# Patient Record
Sex: Female | Born: 1960 | Race: White | Hispanic: No | Marital: Married | Smoking: Current every day smoker
Health system: Southern US, Community
[De-identification: ages and names within clinical notes are randomized; demographics above are authoritative.]

## PROBLEM LIST (undated history)

## (undated) DIAGNOSIS — I10 Essential (primary) hypertension: Secondary | ICD-10-CM

## (undated) DIAGNOSIS — E039 Hypothyroidism, unspecified: Secondary | ICD-10-CM

## (undated) DIAGNOSIS — E079 Disorder of thyroid, unspecified: Secondary | ICD-10-CM

## (undated) HISTORY — PX: TONSILLECTOMY: SUR1361

---

## 1998-01-12 ENCOUNTER — Other Ambulatory Visit: Admission: RE | Admit: 1998-01-12 | Discharge: 1998-01-12 | Payer: Self-pay | Admitting: Obstetrics and Gynecology

## 1999-08-24 ENCOUNTER — Other Ambulatory Visit: Admission: RE | Admit: 1999-08-24 | Discharge: 1999-08-24 | Payer: Self-pay | Admitting: Obstetrics and Gynecology

## 2000-09-09 ENCOUNTER — Other Ambulatory Visit: Admission: RE | Admit: 2000-09-09 | Discharge: 2000-09-09 | Payer: Self-pay | Admitting: Obstetrics and Gynecology

## 2001-09-10 ENCOUNTER — Other Ambulatory Visit: Admission: RE | Admit: 2001-09-10 | Discharge: 2001-09-10 | Payer: Self-pay | Admitting: Obstetrics and Gynecology

## 2002-02-16 ENCOUNTER — Encounter: Payer: Self-pay | Admitting: Gastroenterology

## 2002-02-16 ENCOUNTER — Encounter: Admission: RE | Admit: 2002-02-16 | Discharge: 2002-02-16 | Payer: Self-pay | Admitting: Gastroenterology

## 2002-11-17 ENCOUNTER — Other Ambulatory Visit: Admission: RE | Admit: 2002-11-17 | Discharge: 2002-11-17 | Payer: Self-pay | Admitting: Obstetrics and Gynecology

## 2004-04-12 ENCOUNTER — Other Ambulatory Visit: Admission: RE | Admit: 2004-04-12 | Discharge: 2004-04-12 | Payer: Self-pay | Admitting: Obstetrics and Gynecology

## 2005-06-05 ENCOUNTER — Other Ambulatory Visit: Admission: RE | Admit: 2005-06-05 | Discharge: 2005-06-05 | Payer: Self-pay | Admitting: Obstetrics and Gynecology

## 2007-10-31 ENCOUNTER — Emergency Department (HOSPITAL_COMMUNITY): Admission: EM | Admit: 2007-10-31 | Discharge: 2007-10-31 | Payer: Self-pay | Admitting: Emergency Medicine

## 2012-04-21 ENCOUNTER — Emergency Department (HOSPITAL_COMMUNITY): Payer: 59

## 2012-04-21 ENCOUNTER — Emergency Department (HOSPITAL_COMMUNITY)
Admission: EM | Admit: 2012-04-21 | Discharge: 2012-04-21 | Disposition: A | Discharge status: Home / Self Care | Payer: 59 | Attending: Emergency Medicine | Admitting: Emergency Medicine

## 2012-04-21 ENCOUNTER — Encounter (HOSPITAL_COMMUNITY): Payer: Self-pay | Admitting: *Deleted

## 2012-04-21 DIAGNOSIS — Y998 Other external cause status: Secondary | ICD-10-CM | POA: Insufficient documentation

## 2012-04-21 DIAGNOSIS — E079 Disorder of thyroid, unspecified: Secondary | ICD-10-CM | POA: Insufficient documentation

## 2012-04-21 DIAGNOSIS — Y9389 Activity, other specified: Secondary | ICD-10-CM | POA: Insufficient documentation

## 2012-04-21 DIAGNOSIS — Y92009 Unspecified place in unspecified non-institutional (private) residence as the place of occurrence of the external cause: Secondary | ICD-10-CM | POA: Insufficient documentation

## 2012-04-21 DIAGNOSIS — W208XXA Other cause of strike by thrown, projected or falling object, initial encounter: Secondary | ICD-10-CM | POA: Insufficient documentation

## 2012-04-21 DIAGNOSIS — S9030XA Contusion of unspecified foot, initial encounter: Secondary | ICD-10-CM

## 2012-04-21 HISTORY — DX: Disorder of thyroid, unspecified: E07.9

## 2012-04-21 MED ORDER — HYDROMORPHONE HCL PF 1 MG/ML IJ SOLN
0.5000 mg | Freq: Once | INTRAMUSCULAR | Status: AC
  Administered 2012-04-21: 0.5 mg via INTRAVENOUS
  Filled 2012-04-21: qty 1

## 2012-04-21 MED ORDER — ONDANSETRON HCL 4 MG/2ML IJ SOLN
4.0000 mg | Freq: Once | INTRAMUSCULAR | Status: AC
  Administered 2012-04-21: 4 mg via INTRAVENOUS
  Filled 2012-04-21: qty 2

## 2012-04-21 MED ORDER — HYDROCODONE-ACETAMINOPHEN 5-325 MG PO TABS: 2.0000 | ORAL_TABLET | ORAL | Status: AC | PRN

## 2012-04-21 MED ORDER — IBUPROFEN 800 MG PO TABS: 800.0000 mg | ORAL_TABLET | Freq: Three times a day (TID) | ORAL | Status: AC

## 2012-04-21 NOTE — ED Notes (Signed)
Per EMS, pt from home with reports of left foot injury after a metal table vice fell off table when pt was attempting to move it hitting left knee, left shin and left toes/front of foot. EMS reports left foot swelling but was wrapped upon their arrival. EMS reports giving 350 mcgs IV Fentanyl and 4mg  IV Zofran en route. Pt also arrives with 2 18G IVs in both ACs.

## 2012-04-21 NOTE — ED Notes (Signed)
Patient transported to X-ray 

## 2012-04-21 NOTE — ED Provider Notes (Signed)
History     CSN: [id number]  Arrival date & time 04/21/12  1737   First MD Initiated Contact with Patient 04/21/12 1806      Chief Complaint  Patient presents with  . Foot Injury    left  . Foot Pain    left  . Foot Swelling    left    (Consider location/radiation/quality/duration/timing/severity/associated sxs/prior treatment) HPI GWIN PANGILINAN is a 51 y.o. female presents with left foot injury after a metal table place or and will fell off a table and she was moving things around in her garage. Patient says she's not able to walk on that left foot and he immediately wrapped it. She says she passed out due to pain. Patient received 350 mcg of IV fentanyl and 4 mg IV Zofran en route. Pain is currently 7/10 and in the left foot, is throbbing, nonradiating, it was alleviated somewhat by retinal but still hurts, is worse on movement. She is able to wiggle her toes. No numbness.  Patients having some nausea and vomited once probably secondary to pain medication.  Past Medical History  Diagnosis Date  . Thyroid disease     No past surgical history on file.  No family history on file.  History  Substance Use Topics  . Smoking status: Not on file  . Smokeless tobacco: Not on file  . Alcohol Use:     OB History    Grav Para Term Preterm Abortions TAB SAB Ect Mult Living                  Review of Systems positive for pain in the left foot, mild nausea; Patient denies any fevers or chills, changes in vision, earache, sore throat, neck pain or stiffness, chest pain or pressure, palpitations, syncope, dyspnea, cough, wheezing, abdominal pain, diarrhea, melena, red bloody stools, frequency, dysuria, myalgias, arthralgias, back pain, recent trauma, rash, itching, skin lesions, easy bruising or bleeding, headache, seizures, numbness, tingling or weakness.   Allergies  Demerol; Penicillins; and Sulfa antibiotics  Home Medications   Current Outpatient Rx  Name Route Sig  Dispense Refill  . LEVOTHYROXINE SODIUM 100 MCG PO TABS Oral Take 100 mcg by mouth daily.    Marland Kitchen VITAMIN D (ERGOCALCIFEROL) 50000 UNITS PO CAPS Oral Take 50,000 Units by mouth every 7 (seven) days. On saturdays      BP 167/72  Pulse 95  Temp 99 F (37.2 C)  Resp 20  SpO2 99%  Physical Exam  Nursing notes reviewed.  Electronic medical record reviewed. VITAL SIGNS:   Filed Vitals:   04/21/12 1749 04/21/12 1800  BP:  167/72  Pulse:  95  Temp:  99 F (37.2 C)  Resp:  20  SpO2: 100% 99%   CONSTITUTIONAL: Awake, oriented, appears non-toxic HENT: Atraumatic, normocephalic, oral mucosa pink and moist, airway patent. Nares patent without drainage. External ears normal. EYES: Conjunctiva clear, EOMI, PERRLA NECK: Trachea midline, non-tender, supple CARDIOVASCULAR: Normal heart rate, Normal rhythm, No murmurs, rubs, gallops PULMONARY/CHEST: Clear to auscultation, no rhonchi, wheezes, or rales. Symmetrical breath sounds. Non-tender. ABDOMINAL: Non-distended, soft, non-tender - no rebound or guarding.  BS normal. NEUROLOGIC: Non-focal, moving all four extremities, no gross sensory or motor deficits. EXTREMITIES: No clubbing, cyanosis, or edema. Some mild swelling to the left lower foot it is starting to bruise. Palpation along the lateral aspect - no crepitance, no gross deformity, no hematoma, neurovascularly intact-able to wiggle toes and feel sensation. SKIN: Warm, Dry, No erythema, No rash  ED Course  Procedures (including critical care time)  Labs Reviewed - No data to display Dg Foot Complete Left  04/21/2012  *RADIOLOGY REPORT*  Clinical Data: Blunt trauma to the dorsum of the left foot.  LEFT FOOT - COMPLETE 3+ VIEW  Comparison: None.  Findings: Soft tissue swelling is present in the dorsal aspect of the foot.  There is no acute osseous abnormality.  No radiopaque foreign body is present.  IMPRESSION:  1.  Soft tissue swelling over the dorsum of the foot compatible with edema or  hematoma. 2.  No acute osseous abnormality.   Original Report Authenticated By: Jamesetta Orleans. MATTERN, M.D.      1. Foot contusion       MDM  ELIZABETHANNE LUSHER is a 51 y.o. female presenting after dropping a heavy object on her left foot. X-rays show no fracture in the foot. Discussed this with the patient, also discussed that there is an occult fracture, she may need a repeat x-ray in a week or 2. She can followup with her primary care physician to have that foot reexamined. Patient vomited once after I spoke with her, since after 8 mg of Zofran, added on some more, this is likely due to medications.  I explained the diagnosis and have given explicit precautions to return to the ER including worsening foot pain loss of sensation or any other new or worsening symptoms. The patient understands and accepts the medical plan as it's been dictated and I have answered their questions. Discharge instructions concerning home care and prescriptions have been given.  The patient is STABLE and is discharged to home in good condition.         Jones Skene, MD 04/21/12 1900

## 2013-10-26 ENCOUNTER — Ambulatory Visit (INDEPENDENT_AMBULATORY_CARE_PROVIDER_SITE_OTHER): Payer: 59 | Admitting: Surgery

## 2013-10-26 ENCOUNTER — Observation Stay (HOSPITAL_COMMUNITY)
Admission: AD | Admit: 2013-10-26 | Discharge: 2013-10-27 | Disposition: A | Discharge status: Home / Self Care | Payer: 59 | Source: Ambulatory Visit | Attending: Surgery | Admitting: Surgery

## 2013-10-26 ENCOUNTER — Encounter (HOSPITAL_COMMUNITY): Payer: Self-pay | Admitting: *Deleted

## 2013-10-26 ENCOUNTER — Encounter (INDEPENDENT_AMBULATORY_CARE_PROVIDER_SITE_OTHER): Payer: Self-pay | Admitting: Surgery

## 2013-10-26 VITALS — BP 146/84 | HR 78 | Temp 98.5°F | Resp 16 | Ht 65.0 in | Wt 139.0 lb

## 2013-10-26 DIAGNOSIS — Z79899 Other long term (current) drug therapy: Secondary | ICD-10-CM | POA: Insufficient documentation

## 2013-10-26 DIAGNOSIS — K6289 Other specified diseases of anus and rectum: Secondary | ICD-10-CM | POA: Diagnosis present

## 2013-10-26 DIAGNOSIS — K602 Anal fissure, unspecified: Secondary | ICD-10-CM | POA: Insufficient documentation

## 2013-10-26 DIAGNOSIS — K648 Other hemorrhoids: Principal | ICD-10-CM | POA: Insufficient documentation

## 2013-10-26 DIAGNOSIS — E039 Hypothyroidism, unspecified: Secondary | ICD-10-CM | POA: Insufficient documentation

## 2013-10-26 DIAGNOSIS — F17200 Nicotine dependence, unspecified, uncomplicated: Secondary | ICD-10-CM | POA: Insufficient documentation

## 2013-10-26 HISTORY — DX: Hypothyroidism, unspecified: E03.9

## 2013-10-26 LAB — BASIC METABOLIC PANEL
BUN: 12 mg/dL (ref 6–23)
CALCIUM: 9.6 mg/dL (ref 8.4–10.5)
CO2: 24 mEq/L (ref 19–32)
CREATININE: 1.01 mg/dL (ref 0.50–1.10)
Chloride: 101 mEq/L (ref 96–112)
GFR calc non Af Amer: 63 mL/min — ABNORMAL LOW (ref >90)
GFR, EST AFRICAN AMERICAN: 73 mL/min — AB (ref >90)
Glucose, Bld: 91 mg/dL (ref 70–99)
Potassium: 3.6 mEq/L — ABNORMAL LOW (ref 3.7–5.3)
SODIUM: 138 meq/L (ref 137–147)

## 2013-10-26 LAB — CBC
HCT: 37.5 % (ref 36.0–46.0)
Hemoglobin: 12.9 g/dL (ref 12.0–15.0)
MCH: 28.3 pg (ref 26.0–34.0)
MCHC: 34.4 g/dL (ref 30.0–36.0)
MCV: 82.2 fL (ref 78.0–100.0)
PLATELETS: 269 10*3/uL (ref 150–400)
RBC: 4.56 MIL/uL (ref 3.87–5.11)
RDW: 13.2 % (ref 11.5–15.5)
WBC: 7 10*3/uL (ref 4.0–10.5)

## 2013-10-26 MED ORDER — ENOXAPARIN SODIUM 40 MG/0.4ML ~~LOC~~ SOLN: 40.0000 mg | SUBCUTANEOUS | Status: DC

## 2013-10-26 MED ORDER — POTASSIUM CHLORIDE IN NACL 20-0.9 MEQ/L-% IV SOLN
INTRAVENOUS | Status: DC
  Administered 2013-10-26 – 2013-10-27 (×2): via INTRAVENOUS
  Filled 2013-10-26 (×3): qty 1000

## 2013-10-26 MED ORDER — ENOXAPARIN SODIUM 40 MG/0.4ML ~~LOC~~ SOLN
40.0000 mg | Freq: Once | SUBCUTANEOUS | Status: DC
  Filled 2013-10-26: qty 0.4

## 2013-10-26 MED ORDER — HYDROMORPHONE HCL PF 1 MG/ML IJ SOLN: 1.0000 mg | INTRAMUSCULAR | Status: DC | PRN

## 2013-10-26 MED ORDER — HYDROCHLOROTHIAZIDE 50 MG PO TABS
50.0000 mg | ORAL_TABLET | Freq: Every day | ORAL | Status: DC
  Administered 2013-10-27: 50 mg via ORAL
  Filled 2013-10-26 (×2): qty 1

## 2013-10-26 MED ORDER — ONDANSETRON HCL 4 MG/2ML IJ SOLN: 4.0000 mg | Freq: Four times a day (QID) | INTRAMUSCULAR | Status: DC | PRN

## 2013-10-26 NOTE — Progress Notes (Signed)
Pt continues to run an elevated bp. MD made aware. New order given. Vwilliams, rn.

## 2013-10-26 NOTE — Progress Notes (Signed)
Patient ID: Gina Lucero, female   DOB: [birth year], 53 y.o.   MRN: [id number]  Chief Complaint  Patient presents with  . Hemorrhoids    HPI Gina Lucero is a 53 y.o. female.   HPI Severe rectal pain  This is a pleasant female referred by Dr. Herb Graysammy Spear for evaluation of severe rectal pain. She has had hemorrhoids in the past and has had both surgery and incision and drainages. She has severe diarrhea several days ago and developed significant perianal discomfort. Bowel movements remain painful.  She denies seeing any blood. She has constipation and reports that she has to digitally help her self move her bowels occasionally. She has a history of colon polyps. She has no issues with continence. Apparently, during anoscopy yesterday, a rectal mass was visualized. Past Medical History  Diagnosis Date  . Thyroid disease     History reviewed. No pertinent past surgical history.  History reviewed. No pertinent family history.  Social History History  Substance Use Topics  . Smoking status: Current Every Day Smoker -- 1.00 packs/day    Types: Cigarettes  . Smokeless tobacco: Never Used  . Alcohol Use: Yes     Comment: seldom    Allergies  Allergen Reactions  . Demerol [Meperidine] Nausea And Vomiting  . Penicillins Rash  . Sulfa Antibiotics Rash    Current Outpatient Prescriptions  Medication Sig Dispense Refill  . acyclovir (ZOVIRAX) 200 MG capsule       . HYDROcodone-acetaminophen (NORCO/VICODIN) 5-325 MG per tablet       . levothyroxine (SYNTHROID, LEVOTHROID) 100 MCG tablet Take 100 mcg by mouth daily.      Marland Kitchen. lidocaine (XYLOCAINE) 5 % ointment       . Vitamin D, Ergocalciferol, (DRISDOL) 50000 UNITS CAPS Take 50,000 Units by mouth every 7 (seven) days. On saturdays       No current facility-administered medications for this visit.    Review of Systems Review of Systems  Constitutional: Negative for fever, chills and unexpected weight change.  HENT: Negative for  congestion, hearing loss, sore throat, trouble swallowing and voice change.   Eyes: Negative for visual disturbance.  Respiratory: Negative for cough and wheezing.   Cardiovascular: Negative for chest pain, palpitations and leg swelling.  Gastrointestinal: Positive for rectal pain. Negative for nausea, vomiting, abdominal pain, diarrhea, constipation, blood in stool, abdominal distention and anal bleeding.  Genitourinary: Negative for hematuria, vaginal bleeding and difficulty urinating.  Musculoskeletal: Negative for arthralgias.  Skin: Negative for rash and wound.  Neurological: Negative for seizures, syncope and headaches.  Hematological: Negative for adenopathy. Does not bruise/bleed easily.  Psychiatric/Behavioral: Negative for confusion.    Blood pressure 146/84, pulse 78, temperature 98.5 F (36.9 C), temperature source Oral, resp. rate 16, height 5\' 5"  (1.651 m), weight 139 lb (63.05 kg).  Physical Exam Physical Exam  Constitutional: She appears well-developed and well-nourished. She appears distressed.  Cardiovascular: Normal rate, regular rhythm and normal heart sounds.   Pulmonary/Chest: Effort normal and breath sounds normal. No respiratory distress. She has no wheezes.  Abdominal: Soft. Bowel sounds are normal. She exhibits no distension. There is no tenderness.  Genitourinary:  There are swollen hemorrhoids externally. On digital exam, she has strong sphincter muscles. I can palpate a soft mass or polyp near the posterior midline. Her exam is significantly tender circumferentially.    Data Reviewed   Assessment    Severe rectal pain with rectal mass     Plan    Given her  significant discomfort, I believe she needs admission to the hospital with exam under anesthesia in the operating room to see if this is a torsed polyp or hemorrhoids versus abscess or rectal malignancy. I discussed this with our on-call physician. I will major in by mouth at midnight for possible  surgery tomorrow        Kaysie Michelini A 10/26/2013, 4:23 PM

## 2013-10-27 ENCOUNTER — Observation Stay (HOSPITAL_COMMUNITY): Payer: 59 | Admitting: Registered Nurse

## 2013-10-27 ENCOUNTER — Encounter (HOSPITAL_COMMUNITY): Admission: AD | Disposition: A | Discharge status: Home / Self Care | Payer: Self-pay | Source: Ambulatory Visit

## 2013-10-27 ENCOUNTER — Encounter (HOSPITAL_COMMUNITY): Payer: Self-pay

## 2013-10-27 ENCOUNTER — Encounter (HOSPITAL_COMMUNITY): Payer: 59 | Admitting: Registered Nurse

## 2013-10-27 DIAGNOSIS — K648 Other hemorrhoids: Secondary | ICD-10-CM

## 2013-10-27 HISTORY — PX: HEMORRHOID SURGERY: SHX153

## 2013-10-27 SURGERY — HEMORRHOIDECTOMY
Anesthesia: General

## 2013-10-27 MED ORDER — ONDANSETRON HCL 4 MG/2ML IJ SOLN
INTRAMUSCULAR | Status: DC | PRN
  Administered 2013-10-27: 4 mg via INTRAVENOUS

## 2013-10-27 MED ORDER — CIPROFLOXACIN IN D5W 400 MG/200ML IV SOLN
400.0000 mg | Freq: Once | INTRAVENOUS | Status: AC
  Administered 2013-10-27: 400 mg via INTRAVENOUS
  Filled 2013-10-27: qty 200

## 2013-10-27 MED ORDER — OXYCODONE HCL 5 MG PO TABS: 5.0000 mg | ORAL_TABLET | ORAL | Status: DC | PRN

## 2013-10-27 MED ORDER — BUPIVACAINE HCL (PF) 0.5 % IJ SOLN
INTRAMUSCULAR | Status: AC
  Filled 2013-10-27: qty 30

## 2013-10-27 MED ORDER — ACETAMINOPHEN 10 MG/ML IV SOLN
1000.0000 mg | Freq: Once | INTRAVENOUS | Status: DC
  Filled 2013-10-27: qty 100

## 2013-10-27 MED ORDER — PROMETHAZINE HCL 25 MG/ML IJ SOLN: 6.2500 mg | INTRAMUSCULAR | Status: DC | PRN

## 2013-10-27 MED ORDER — LACTATED RINGERS IV SOLN
INTRAVENOUS | Status: DC
Start: 2013-10-27 — End: 2013-10-27
  Administered 2013-10-27: 15:00:00 via INTRAVENOUS
  Administered 2013-10-27: 1000 mL via INTRAVENOUS

## 2013-10-27 MED ORDER — FENTANYL CITRATE 0.05 MG/ML IJ SOLN
INTRAMUSCULAR | Status: AC
  Filled 2013-10-27: qty 5

## 2013-10-27 MED ORDER — LIDOCAINE HCL (CARDIAC) 10 MG/ML IV SOLN
INTRAVENOUS | Status: DC | PRN
  Administered 2013-10-27: 50 mg via INTRAVENOUS
  Administered 2013-10-27: 100 mg via INTRAVENOUS

## 2013-10-27 MED ORDER — LIDOCAINE HCL (CARDIAC) 20 MG/ML IV SOLN
INTRAVENOUS | Status: AC
  Filled 2013-10-27: qty 5

## 2013-10-27 MED ORDER — BUPIVACAINE LIPOSOME 1.3 % IJ SUSP
20.0000 mL | Freq: Once | INTRAMUSCULAR | Status: AC
  Administered 2013-10-27: 20 mL
  Filled 2013-10-27: qty 20

## 2013-10-27 MED ORDER — METRONIDAZOLE IN NACL 5-0.79 MG/ML-% IV SOLN
500.0000 mg | Freq: Once | INTRAVENOUS | Status: AC
Start: 2013-10-27 — End: 2013-10-27
  Administered 2013-10-27: 500 mg via INTRAVENOUS
  Filled 2013-10-27: qty 100

## 2013-10-27 MED ORDER — FENTANYL CITRATE 0.05 MG/ML IJ SOLN: 25.0000 ug | INTRAMUSCULAR | Status: DC | PRN

## 2013-10-27 MED ORDER — 0.9 % SODIUM CHLORIDE (POUR BTL) OPTIME
TOPICAL | Status: DC | PRN
  Administered 2013-10-27: 1000 mL

## 2013-10-27 MED ORDER — LACTATED RINGERS IV SOLN: INTRAVENOUS | Status: DC

## 2013-10-27 MED ORDER — MIDAZOLAM HCL 5 MG/5ML IJ SOLN
INTRAMUSCULAR | Status: DC | PRN
  Administered 2013-10-27: 2 mg via INTRAVENOUS

## 2013-10-27 MED ORDER — DEXAMETHASONE SODIUM PHOSPHATE 10 MG/ML IJ SOLN
INTRAMUSCULAR | Status: AC
  Filled 2013-10-27: qty 1

## 2013-10-27 MED ORDER — MIDAZOLAM HCL 2 MG/2ML IJ SOLN
INTRAMUSCULAR | Status: AC
  Filled 2013-10-27: qty 2

## 2013-10-27 MED ORDER — DEXAMETHASONE SODIUM PHOSPHATE 10 MG/ML IJ SOLN
INTRAMUSCULAR | Status: DC | PRN
  Administered 2013-10-27: 10 mg via INTRAVENOUS

## 2013-10-27 MED ORDER — PROPOFOL 10 MG/ML IV BOLUS
INTRAVENOUS | Status: AC
  Filled 2013-10-27: qty 20

## 2013-10-27 MED ORDER — SODIUM CHLORIDE 0.9 % IJ SOLN
INTRAMUSCULAR | Status: AC
  Filled 2013-10-27: qty 10

## 2013-10-27 MED ORDER — FENTANYL CITRATE 0.05 MG/ML IJ SOLN
INTRAMUSCULAR | Status: DC | PRN
  Administered 2013-10-27: 50 ug via INTRAVENOUS
  Administered 2013-10-27: 100 ug via INTRAVENOUS

## 2013-10-27 MED ORDER — PROPOFOL 10 MG/ML IV BOLUS
INTRAVENOUS | Status: DC | PRN
  Administered 2013-10-27: 200 mg via INTRAVENOUS

## 2013-10-27 MED ORDER — ONDANSETRON HCL 4 MG/2ML IJ SOLN
INTRAMUSCULAR | Status: AC
  Filled 2013-10-27: qty 2

## 2013-10-27 SURGICAL SUPPLY — 35 items
BLADE HEX COATED 2.75 (ELECTRODE) ×2 IMPLANT
BLADE SURG 15 STRL LF DISP TIS (BLADE) ×1 IMPLANT
BLADE SURG 15 STRL SS (BLADE) ×2
BRIEF STRETCH FOR OB PAD LRG (UNDERPADS AND DIAPERS) ×3 IMPLANT
CANISTER SUCTION 2500CC (MISCELLANEOUS) ×1 IMPLANT
DECANTER SPIKE VIAL GLASS SM (MISCELLANEOUS) ×1 IMPLANT
DRAPE LG THREE QUARTER DISP (DRAPES) ×2 IMPLANT
DRSG PAD ABDOMINAL 8X10 ST (GAUZE/BANDAGES/DRESSINGS) IMPLANT
ELECT REM PT RETURN 9FT ADLT (ELECTROSURGICAL) ×2
ELECTRODE REM PT RTRN 9FT ADLT (ELECTROSURGICAL) ×1 IMPLANT
GAUZE SPONGE 4X4 16PLY XRAY LF (GAUZE/BANDAGES/DRESSINGS) ×2 IMPLANT
GLOVE BIOGEL PI IND STRL 7.0 (GLOVE) ×1 IMPLANT
GLOVE BIOGEL PI INDICATOR 7.0 (GLOVE) ×1
GLOVE ECLIPSE 8.0 STRL XLNG CF (GLOVE) ×2 IMPLANT
GLOVE INDICATOR 8.0 STRL GRN (GLOVE) ×4 IMPLANT
GOWN STRL REUS W/TWL LRG LVL3 (GOWN DISPOSABLE) ×2 IMPLANT
GOWN STRL REUS W/TWL XL LVL3 (GOWN DISPOSABLE) ×3 IMPLANT
KIT BASIN OR (CUSTOM PROCEDURE TRAY) ×2 IMPLANT
LUBRICANT JELLY K Y 4OZ (MISCELLANEOUS) ×2 IMPLANT
MANIFOLD NEPTUNE II (INSTRUMENTS) ×1 IMPLANT
NDL HYPO 25X1 1.5 SAFETY (NEEDLE) ×1 IMPLANT
NEEDLE HYPO 25X1 1.5 SAFETY (NEEDLE) ×2 IMPLANT
NS IRRIG 1000ML POUR BTL (IV SOLUTION) ×2 IMPLANT
PACK LITHOTOMY IV (CUSTOM PROCEDURE TRAY) ×2 IMPLANT
PAD ABD 8X10 STRL (GAUZE/BANDAGES/DRESSINGS) ×1 IMPLANT
PENCIL BUTTON HOLSTER BLD 10FT (ELECTRODE) ×2 IMPLANT
SHEARS HARMONIC 9CM CVD (BLADE) ×1 IMPLANT
SPONGE GAUZE 4X4 12PLY (GAUZE/BANDAGES/DRESSINGS) IMPLANT
SPONGE SURGIFOAM ABS GEL 100 (HEMOSTASIS) ×1 IMPLANT
SPONGE SURGIFOAM ABS GEL 12-7 (HEMOSTASIS) ×1 IMPLANT
SUT CHROMIC 2 0 SH (SUTURE) IMPLANT
SUT CHROMIC 3 0 SH 27 (SUTURE) IMPLANT
SYR CONTROL 10ML LL (SYRINGE) ×2 IMPLANT
TOWEL OR 17X26 10 PK STRL BLUE (TOWEL DISPOSABLE) ×2 IMPLANT
YANKAUER SUCT BULB TIP 10FT TU (MISCELLANEOUS) ×2 IMPLANT

## 2013-10-27 NOTE — Progress Notes (Signed)
Doing well postop.  Wants to go home and I think this is appropriate given the operation and findings.  Will discharge.

## 2013-10-27 NOTE — Progress Notes (Signed)
Patient verbalized understanding of discharge instructions. Patient assessment has not changed from am. Patient is stable at discharge. Patient was given prescription and d/c forms.

## 2013-10-27 NOTE — Discharge Instructions (Signed)
[facility]  RECTAL SURGERY POST OP INSTRUCTIONS: POST OP INSTRUCTIONS  Always review your discharge instruction sheet given to you by the facility where your surgery was performed. IF YOU HAVE DISABILITY OR FAMILY LEAVE FORMS, YOU MUST BRING THEM TO THE OFFICE FOR PROCESSING.   DO NOT GIVE THEM TO YOUR DOCTOR.  1. A  prescription for pain medication may be given to you upon discharge.  Take your pain medication as prescribed, if needed.  If narcotic pain medicine is not needed, then you may take acetaminophen (Tylenol) or ibuprofen (Advil) as needed. 2. Take your usually prescribed medications unless otherwise directed. 3. If you need a refill on your pain medication, please contact your pharmacy.  They will contact our office to request authorization. Prescriptions will not be filled after 5 pm or on week-ends. 4. You should follow a light diet the first 48 hours after arrival home, such as soup and crackers, etc.  Be sure to include lots of fluids daily.  Resume your normal diet 2-3 days after surgery.. 5. Most patients will experience some swelling and discomfort in the rectal area. Ice packs, reclining and warm tub soaks will help.  Swelling and discomfort can take several days to resolve.  6. It is common to experience some constipation if taking pain medication after surgery.  Increasing fluid intake and taking a stool softener (such as Colace) will usually help or prevent this problem from occurring.  A mild laxative (Milk of Magnesia or Miralax) should be taken according to package directions if there are no bowel movements after 48 hours. 7. Unless discharge instructions indicate otherwise, leave your bandage dry and in place for 24 hours, or remove the bandage if you have a bowel movement. You may notice a small amount of bleeding with bowel movements for the first few days. You may have some packing in the rectum which will come out over the first day or two. You  will need to wear an absorbent pad or soft cotton gauze in your underwear until the drainage stops.it. 8. ACTIVITIES:  You may resume regular (light) daily activities beginning the next day--such as daily self-care, walking, climbing stairs--gradually increasing activities as tolerated.  You may have sexual intercourse when it is comfortable.  Refrain from any heavy lifting or straining for the next 5 days.  a. You may drive when you are no longer taking prescription pain medication, you can comfortably wear a seatbelt, and you can safely maneuver your car and apply brakes. b. RETURN TO WORK: : 3-5 days when comfortable.____________________ c.  9. You should see your doctor in the office for a follow-up appointment approximately 2-3 weeks after your surgery.  Make sure that you call for this appointment within a day or two after you arrive home to insure a convenient appointment time. 10. OTHER INSTRUCTIONS:  __________________________________________________________________________________________________________________________________________________________________________________________  WHEN TO CALL YOUR DOCTOR: 1. Fever over 101.0 2. Inability to urinate 3. Nausea and/or vomiting 4. Extreme swelling or bruising 5. Continued bleeding from rectum. 6. Increased pain, redness, or drainage from the incision 7. Constipation  The clinic staff is available to answer your questions during regular business hours.  Please dont hesitate to call and ask to speak to one of the nurses for clinical concerns.  If you have a medical emergency, go to the nearest emergency room or call 911.  [facility] is always on call at the hospital   [address], [address],  [place], [place]  [address] ?  [address], [place], [place]   [address] [phone] ? [phone] ? FAX [phone] Web site: [email or web address]

## 2013-10-27 NOTE — Op Note (Signed)
Operative Note  Gina Lucero female 53 y.o. 10/27/2013   PREOPERATIVE DX:  Severe anorectal pain and rectal mass  POSTOPERATIVE DX:  Acute, superficial anal fissure and inflamed, right posterior prolapsing internal hemorrhoid  PROCEDURE:  Examination under anesthesia, anoscopy, single column hemorrhoidectomy (right posterior)         Surgeon: Adolph PollackOSENBOWER,Anaija Wissink J   Assistants: None  Anesthesia: General endotracheal anesthesia  Indications: This is a 53 year old female who had an episode of explosive diarrhea last week. She then began having burning pain that increased in severity. She tried over-the-counter agents without success. She was seen in the urgent office yesterday. She had a lot of pain and tenderness on exam and a rectal mass was noted. She was admitted yesterday and is now brought to the OR for the above procedure.    Procedure Detail:  She was seen in the holding area. She is brought to the operating room placed supine on the operating table and a general anesthetic was given. She was placed in the lithotomy position. The anorectal area was sterilely prepped and draped.  A timeout was performed.  External inspection demonstrated multiple skin tags. An inflamed, fleshy prolapsed right posterior internal hemorrhoid was noted. There is also a superficial acute fissure adjacent to this at the 6:00 position. On digital rectal exam, no firm masses were felt. Anoscopy was performed and no masses were seen other than the internal hemorrhoids as described. The superficial acute anal fissure was also seen.  Using the harmonic scalpel, I performed a single column hemorrhoidectomy of the internal/external hemorrhoid in the right posterior position preserving the muscle. Because the anal fissure was superficial and acute, I did not feel that lateral internal sphincterotomy was indicated. Following hemorrhoidectomy, hemostasis was adequate. An anal block was then performed with Exparel.  A  piece of Gelfoam was placed in the anal canal and a bulky dressing was applied.  She tolerated the procedure well without any apparent complications and was taken to the recovery room in satisfactory condition.    Findings:  A. Acute superficial posterior anal fissure was seen. It was very shallow and small. There was a inflamed, prolapsing right posterior internal hemorrhoid noted.  Estimated Blood Loss:  less than 100 mL         Drains: none  Blood Given: none          Specimens: Right posterior internal and external hemorrhoid.        Complications:  * No complications entered in OR log *         Disposition: PACU - hemodynamically stable.         Condition: stable

## 2013-10-27 NOTE — Discharge Summary (Signed)
Physician Discharge Summary  Patient ID: Gina Lucero MRN: [id number] DOB/AGE: Sep 17, 1960 53 y.o.  Admit date: 10/26/2013 Discharge date: 10/27/2013  Admission Diagnoses:  Severe anorectal pain and rectal mass  Discharge Diagnoses:   Inflamed, prolapsing right posterior hemorrhoid and superficial posterior anal fissure.    Discharged Condition: good  Hospital Course: She was admitted the evening of 3/17 and taken to the OR on 3/18 the above diagnoses were noted.  There was no worrisome anal or distal rectal mass.  She underwent a single column hemorrhoidectomy and did well.  She was able to be discharged soon after the operation.  Instructions were given to her.  Consults: None  Significant Diagnostic Studies: none  Treatments: surgery: As above.  Discharge Exam: Blood pressure 180/84, pulse 55, temperature 97.7 F (36.5 C), temperature source Oral, resp. rate 18, height 5\' 5"  (1.651 m), weight 139 lb (63.05 kg), last menstrual period 10/10/2013, SpO2 98.00%.   Disposition: 01-Home or Self Care     Medication List    STOP taking these medications       HYDROcodone-acetaminophen 5-325 MG per tablet  Commonly known as:  NORCO/VICODIN     hydrocortisone 25 MG suppository  Commonly known as:  ANUSOL-HC     lidocaine 5 % ointment  Commonly known as:  XYLOCAINE      TAKE these medications       acyclovir 200 MG capsule  Commonly known as:  ZOVIRAX  200 mg 2 (two) times daily.     ibuprofen 200 MG tablet  Commonly known as:  ADVIL,MOTRIN  Take 400 mg by mouth every 6 (six) hours as needed (pain).     levothyroxine 100 MCG tablet  Commonly known as:  SYNTHROID, LEVOTHROID  Take 100 mcg by mouth daily.     oxyCODONE 5 MG immediate release tablet  Commonly known as:  Oxy IR/ROXICODONE  Take 1-2 tablets (5-10 mg total) by mouth every 4 (four) hours as needed.     Vitamin D (Ergocalciferol) 50000 UNITS Caps capsule  Commonly known as:  DRISDOL  Take 50,000 Units  by mouth every 7 (seven) days. On saturdays         Signed: Stefana Lodico J 10/27/2013, 5:31 PM

## 2013-10-27 NOTE — Transfer of Care (Signed)
Immediate Anesthesia Transfer of Care Note  Patient: Gina Lucero  Procedure(s) Performed: Procedure(s): Exam under anesthesia  removal of prolapsed hemorrhoid (N/A)  Patient Location: PACU  Anesthesia Type:General  Level of Consciousness: awake, alert  and patient cooperative  Airway & Oxygen Therapy: Patient Spontanous Breathing and Patient connected to face mask oxygen  Post-op Assessment: Report given to PACU RN, Post -op Vital signs reviewed and stable and Patient moving all extremities X 4  Post vital signs: stable  Complications: No apparent anesthesia complications

## 2013-10-27 NOTE — H&P (Signed)
Chief Complaint   Patient presents with   .  Hemorrhoids   HPI  Gina Lucero is a 53 y.o. female.  HPI  Severe rectal pain  This is a pleasant female referred by Dr. Herb Grays for evaluation of severe rectal pain. She has had hemorrhoids in the past and has had both surgery and incision and drainages. She has severe diarrhea several days ago and developed significant perianal discomfort. Bowel movements remain painful. She denies seeing any blood. She has constipation and reports that she has to digitally help her self move her bowels occasionally. She has a history of colon polyps. She has no issues with continence. Apparently, during anoscopy yesterday, a rectal mass was visualized.  Past Medical History   Diagnosis  Date   .  Thyroid disease    History reviewed. No pertinent past surgical history.  History reviewed. No pertinent family history.  Social History  History   Substance Use Topics   .  Smoking status:  Current Every Day Smoker -- 1.00 packs/day     Types:  Cigarettes   .  Smokeless tobacco:  Never Used   .  Alcohol Use:  Yes      Comment: seldom    Allergies   Allergen  Reactions   .  Demerol [Meperidine]  Nausea And Vomiting   .  Penicillins  Rash   .  Sulfa Antibiotics  Rash    Current Outpatient Prescriptions   Medication  Sig  Dispense  Refill   .  acyclovir (ZOVIRAX) 200 MG capsule      .  HYDROcodone-acetaminophen (NORCO/VICODIN) 5-325 MG per tablet      .  levothyroxine (SYNTHROID, LEVOTHROID) 100 MCG tablet  Take 100 mcg by mouth daily.     Marland Kitchen  lidocaine (XYLOCAINE) 5 % ointment      .  Vitamin D, Ergocalciferol, (DRISDOL) 50000 UNITS CAPS  Take 50,000 Units by mouth every 7 (seven) days. On saturdays      No current facility-administered medications for this visit.   Review of Systems  Review of Systems  Constitutional: Negative for fever, chills and unexpected weight change.  HENT: Negative for congestion, hearing loss, sore throat, trouble  swallowing and voice change.  Eyes: Negative for visual disturbance.  Respiratory: Negative for cough and wheezing.  Cardiovascular: Negative for chest pain, palpitations and leg swelling.  Gastrointestinal: Positive for rectal pain. Negative for nausea, vomiting, abdominal pain, diarrhea, constipation, blood in stool, abdominal distention and anal bleeding.  Genitourinary: Negative for hematuria, vaginal bleeding and difficulty urinating.  Musculoskeletal: Negative for arthralgias.  Skin: Negative for rash and wound.  Neurological: Negative for seizures, syncope and headaches.  Hematological: Negative for adenopathy. Does not bruise/bleed easily.  Psychiatric/Behavioral: Negative for confusion.  Blood pressure 146/84, pulse 78, temperature 98.5 F (36.9 C), temperature source Oral, resp. rate 16, height 5\' 5"  (1.651 m), weight 139 lb (63.05 kg).  Physical Exam  Physical Exam  Constitutional: She appears well-developed and well-nourished. She appears distressed.  Cardiovascular: Normal rate, regular rhythm and normal heart sounds.  Pulmonary/Chest: Effort normal and breath sounds normal. No respiratory distress. She has no wheezes.  Abdominal: Soft. Bowel sounds are normal. She exhibits no distension. There is no tenderness.  Genitourinary:  There are swollen hemorrhoids externally. On digital exam, she has strong sphincter muscles. I can palpate a soft mass or polyp near the posterior midline. Her exam is significantly tender circumferentially.  Data Reviewed  Assessment  Severe rectal pain with  rectal mass  Plan  Given her significant discomfort, I believe she needs admission to the hospital with exam under anesthesia in the operating room to see if this is a torsed polyp or hemorrhoids versus abscess or rectal malignancy. I discussed this with our on-call physician. I will major in by mouth at midnight for possible surgery tomorrow

## 2013-10-27 NOTE — Anesthesia Preprocedure Evaluation (Signed)
Anesthesia Evaluation  Patient identified by MRN, date of birth, ID band Patient awake    Reviewed: Allergy & Precautions, H&P , NPO status , Patient's Chart, lab work & pertinent test results  Airway Mallampati: II TM Distance: >3 FB Neck ROM: Full    Dental no notable dental hx.    Pulmonary Current Smoker,  breath sounds clear to auscultation  Pulmonary exam normal       Cardiovascular negative cardio ROS  Rhythm:Regular Rate:Normal     Neuro/Psych negative neurological ROS  negative psych ROS   GI/Hepatic negative GI ROS, Neg liver ROS,   Endo/Other  negative endocrine ROSHypothyroidism   Renal/GU negative Renal ROS  negative genitourinary   Musculoskeletal negative musculoskeletal ROS (+)   Abdominal   Peds negative pediatric ROS (+)  Hematology negative hematology ROS (+)   Anesthesia Other Findings   Reproductive/Obstetrics negative OB ROS                           Anesthesia Physical Anesthesia Plan  ASA: II  Anesthesia Plan: General   Post-op Pain Management:    Induction: Intravenous  Airway Management Planned: LMA and Oral ETT  Additional Equipment:   Intra-op Plan:   Post-operative Plan: Extubation in OR  Informed Consent: I have reviewed the patients History and Physical, chart, labs and discussed the procedure including the risks, benefits and alternatives for the proposed anesthesia with the patient or authorized representative who has indicated his/her understanding and acceptance.   Dental advisory given  Plan Discussed with: CRNA  Anesthesia Plan Comments:         Anesthesia Quick Evaluation

## 2013-10-27 NOTE — Progress Notes (Signed)
  Subjective: Patient seen and examined.  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  Pain a little better this AM.  Objective: Vital signs in last 24 hours: Temp:  [98.1 F (36.7 C)-98.5 F (36.9 C)] 98.1 F (36.7 C) (03/18 0537) Pulse Rate:  [63-87] 67 (03/18 0537) Resp:  [16] 16 (03/18 0537) BP: (144-207)/(72-117) 144/77 mmHg (03/18 0537) SpO2:  [97 %-100 %] 99 % (03/18 0537) Weight:  [139 lb (63.05 kg)] 139 lb (63.05 kg) (03/17 2028) Last BM Date: 10/26/13  Intake/Output from previous day: 03/17 0701 - 03/18 0700 In: 946.3 [P.O.:240; I.V.:706.3] Out: 700 [Urine:700] Intake/Output this shift:    PE: General- In NAD Anorectal-soft, external skin tags, no erythema.  Lab Results:   Recent Labs  10/26/13 1945  WBC 7.0  HGB 12.9  HCT 37.5  PLT 269   BMET  Recent Labs  10/26/13 1945  NA 138  K 3.6*  CL 101  CO2 24  GLUCOSE 91  BUN 12  CREATININE 1.01  CALCIUM 9.6   PT/INR No results found for this basename: LABPROT, INR,  in the last 72 hours Comprehensive Metabolic Panel:    Component Value Date/Time   NA 138 10/26/2013 1945   K 3.6* 10/26/2013 1945   CL 101 10/26/2013 1945   CO2 24 10/26/2013 1945   BUN 12 10/26/2013 1945   CREATININE 1.01 10/26/2013 1945   GLUCOSE 91 10/26/2013 1945   CALCIUM 9.6 10/26/2013 1945     Studies/Results: No results found.  Anti-infectives: Anti-infectives   None      Assessment Principal Problem:   Rectal pain with mass-etiology is unclear.  She has a h/o colon polyps and is due for a colonoscopy.    LOS: 1 day   Plan: To OR for EUA, excision of mass.  I explained to her that this procedure would be diagnostic and possibly therapeutic.  She may require other procedures depending upon what is found.  We discussed the risks of the procedure which include but are not limited to bleeding, infection, anesthesia, continued pain, anal stenosis, incontinence.  We also  discussed aftercare.  She seems to understand all of this.   Dymond Gutt J 10/27/2013

## 2013-10-28 ENCOUNTER — Encounter (HOSPITAL_COMMUNITY): Payer: Self-pay | Admitting: General Surgery

## 2013-10-28 NOTE — Anesthesia Postprocedure Evaluation (Signed)
  Anesthesia Post-op Note  Patient: Gina Lucero  Procedure(s) Performed: Procedure(s) (LRB): Exam under anesthesia  removal of prolapsed hemorrhoid (N/A)  Patient Location: PACU  Anesthesia Type: General  Level of Consciousness: awake and alert   Airway and Oxygen Therapy: Patient Spontanous Breathing  Post-op Pain: mild  Post-op Assessment: Post-op Vital signs reviewed, Patient's Cardiovascular Status Stable, Respiratory Function Stable, Patent Airway and No signs of Nausea or vomiting  Last Vitals:  Filed Vitals:   10/27/13 1745  BP: 164/80  Pulse: 60  Temp: 36.7 C  Resp: 18    Post-op Vital Signs: stable   Complications: No apparent anesthesia complications

## 2013-11-02 ENCOUNTER — Telehealth (INDEPENDENT_AMBULATORY_CARE_PROVIDER_SITE_OTHER): Payer: Self-pay

## 2013-11-02 NOTE — Telephone Encounter (Signed)
Pt made aware pathology benign hemorrhoid.

## 2013-11-23 ENCOUNTER — Ambulatory Visit (INDEPENDENT_AMBULATORY_CARE_PROVIDER_SITE_OTHER): Payer: 59 | Admitting: Surgery

## 2013-11-23 ENCOUNTER — Encounter (INDEPENDENT_AMBULATORY_CARE_PROVIDER_SITE_OTHER): Payer: Self-pay | Admitting: Surgery

## 2013-11-23 VITALS — BP 132/80 | HR 80 | Temp 98.6°F | Resp 14 | Ht 65.0 in | Wt 138.0 lb

## 2013-11-23 DIAGNOSIS — Z09 Encounter for follow-up examination after completed treatment for conditions other than malignant neoplasm: Secondary | ICD-10-CM

## 2013-11-23 MED ORDER — HYDROCORTISONE 2.5 % RE CREA: 1.0000 "application " | TOPICAL_CREAM | Freq: Two times a day (BID) | RECTAL | Status: DC

## 2013-11-23 NOTE — Progress Notes (Signed)
Subjective:     Patient ID: Gina Lucero, female   DOB: 11-27-1960, 53 y.o.   MRN: [id number]  HPI She is here for her first postoperative visit status post hemorrhoidectomy by Dr. Abbey Chattersosenbower.  She is doing very well and has no complaints  Review of Systems     Objective:   Physical Exam On exam, she has hemorrhoidal skin Tagging, but her incisions are otherwise Healing well    Assessment:     Stable postop     Plan:     She will continue her MiraLAX as needed as well sitz baths. I did write her for Proctofoam for the skin tagging. We will see her here as needed

## 2015-03-02 ENCOUNTER — Other Ambulatory Visit: Payer: Self-pay | Admitting: Obstetrics and Gynecology

## 2015-10-30 ENCOUNTER — Encounter (HOSPITAL_COMMUNITY): Payer: Self-pay | Admitting: *Deleted

## 2015-10-30 ENCOUNTER — Emergency Department (HOSPITAL_COMMUNITY): Payer: 59

## 2015-10-30 ENCOUNTER — Emergency Department (HOSPITAL_COMMUNITY)
Admission: EM | Admit: 2015-10-30 | Discharge: 2015-10-30 | Disposition: A | Discharge status: Home / Self Care | Payer: 59 | Attending: Emergency Medicine | Admitting: Emergency Medicine

## 2015-10-30 DIAGNOSIS — E039 Hypothyroidism, unspecified: Secondary | ICD-10-CM | POA: Diagnosis not present

## 2015-10-30 DIAGNOSIS — F17210 Nicotine dependence, cigarettes, uncomplicated: Secondary | ICD-10-CM | POA: Diagnosis not present

## 2015-10-30 DIAGNOSIS — Z79899 Other long term (current) drug therapy: Secondary | ICD-10-CM | POA: Insufficient documentation

## 2015-10-30 DIAGNOSIS — I10 Essential (primary) hypertension: Secondary | ICD-10-CM | POA: Diagnosis not present

## 2015-10-30 DIAGNOSIS — Z791 Long term (current) use of non-steroidal anti-inflammatories (NSAID): Secondary | ICD-10-CM | POA: Diagnosis not present

## 2015-10-30 DIAGNOSIS — R0789 Other chest pain: Secondary | ICD-10-CM | POA: Insufficient documentation

## 2015-10-30 DIAGNOSIS — R079 Chest pain, unspecified: Secondary | ICD-10-CM

## 2015-10-30 HISTORY — DX: Essential (primary) hypertension: I10

## 2015-10-30 LAB — BASIC METABOLIC PANEL
Anion gap: 8 (ref 5–15)
BUN: 16 mg/dL (ref 6–20)
CALCIUM: 9.2 mg/dL (ref 8.9–10.3)
CO2: 24 mmol/L (ref 22–32)
CREATININE: 0.97 mg/dL (ref 0.44–1.00)
Chloride: 108 mmol/L (ref 101–111)
GFR calc non Af Amer: >60 mL/min (ref >60)
Glucose, Bld: 102 mg/dL — ABNORMAL HIGH (ref 65–99)
Potassium: 3.6 mmol/L (ref 3.5–5.1)
Sodium: 140 mmol/L (ref 135–145)

## 2015-10-30 LAB — CBC WITH DIFFERENTIAL/PLATELET
Basophils Absolute: 0.1 10*3/uL (ref 0.0–0.1)
Basophils Relative: 2 %
Eosinophils Absolute: 0.6 10*3/uL (ref 0.0–0.7)
Eosinophils Relative: 9 %
HEMATOCRIT: 40.9 % (ref 36.0–46.0)
Hemoglobin: 14 g/dL (ref 12.0–15.0)
Lymphocytes Relative: 28 %
Lymphs Abs: 1.7 10*3/uL (ref 0.7–4.0)
MCH: 29.6 pg (ref 26.0–34.0)
MCHC: 34.2 g/dL (ref 30.0–36.0)
MCV: 86.5 fL (ref 78.0–100.0)
MONO ABS: 0.7 10*3/uL (ref 0.1–1.0)
MONOS PCT: 11 %
NEUTROS ABS: 3.1 10*3/uL (ref 1.7–7.7)
Neutrophils Relative %: 50 %
Platelets: 257 10*3/uL (ref 150–400)
RBC: 4.73 MIL/uL (ref 3.87–5.11)
RDW: 12.6 % (ref 11.5–15.5)
WBC: 6.2 10*3/uL (ref 4.0–10.5)

## 2015-10-30 LAB — I-STAT TROPONIN, ED: TROPONIN I, POC: 0 ng/mL (ref 0.00–0.08)

## 2015-10-30 LAB — TROPONIN I: Troponin I: <0.03 ng/mL (ref <0.031)

## 2015-10-30 NOTE — ED Provider Notes (Signed)
CSN: [id number]     Arrival date & time 10/30/15  0444 History   First MD Initiated Contact with Patient 10/30/15 [phone]     Chief Complaint  Patient presents with  . Chest Pain     (Consider location/radiation/quality/duration/timing/severity/associated sxs/prior Treatment) HPI Comments: Patient presents to the emergency room for evaluation of chest pain. Patient reports that she had onset of pain in the center and left side of her chest that she thought was indigestion. She reports that she tried to go back to sleep but was unable to. Symptoms were present for approximately 45 minutes and then spontaneously resolved. She is not experiencing any shortness of breath. She arrives to the ER by ambulance having received aspirin. She reports that she feels some burning sensation in the left side of her neck currently. She says it feels like she "slept on it wrong".  Patient is a 55 y.o. female presenting with chest pain.  Chest Pain   Past Medical History  Diagnosis Date  . Thyroid disease   . Hypothyroidism   . Hypertension    Past Surgical History  Procedure Laterality Date  . Tonsillectomy    . Hemorrhoid surgery N/A 10/27/2013    Procedure: Exam under anesthesia  removal of prolapsed hemorrhoid;  Surgeon: Adolph Pollack, MD;  Location: [facility];  Service: General;  Laterality: N/A;   No family history on file. Social History  Substance Use Topics  . Smoking status: Current Every Day Smoker -- 1.00 packs/day for 27 years    Types: Cigarettes  . Smokeless tobacco: Never Used  . Alcohol Use: Yes     Comment: seldom   OB History    No data available     Review of Systems  Cardiovascular: Positive for chest pain.  All other systems reviewed and are negative.     Allergies  Demerol; Penicillins; and Sulfa antibiotics  Home Medications   Prior to Admission medications   Medication Sig Start Date End Date Taking? Authorizing Provider  acyclovir (ZOVIRAX) 200 MG capsule  200 mg 2 (two) times daily.  10/18/13  Yes Historical Provider, MD  hydrochlorothiazide (HYDRODIURIL) 25 MG tablet  11/10/13  Yes Historical Provider, MD  ibuprofen (ADVIL,MOTRIN) 200 MG tablet Take 400 mg by mouth every 6 (six) hours as needed (pain).   Yes Historical Provider, MD  levothyroxine (SYNTHROID, LEVOTHROID) 100 MCG tablet Take 100 mcg by mouth daily.   Yes Historical Provider, MD  polyethylene glycol (MIRALAX / GLYCOLAX) packet Take 17 g by mouth daily.   Yes Historical Provider, MD  Vitamin D, Ergocalciferol, (DRISDOL) 50000 UNITS CAPS Take 50,000 Units by mouth every 7 (seven) days. On saturdays   Yes Historical Provider, MD  hydrocortisone (PROCTOZONE-HC) 2.5 % rectal cream Place 1 application rectally 2 (two) times daily. 11/23/13   Abigail Miyamoto, MD   BP 142/77 mmHg  Pulse 58  Temp(Src) 98.4 F (36.9 C) (Oral)  Resp 15  Ht  (1.626 m)  Wt 150 lb (68.04 kg)  BMI 25.73 kg/m2  SpO2 98%  LMP 10/10/2013 Physical Exam  Constitutional: She is oriented to person, place, and time. She appears well-developed and well-nourished. No distress.  HENT:  Head: Normocephalic and atraumatic.  Right Ear: Hearing normal.  Left Ear: Hearing normal.  Nose: Nose normal.  Mouth/Throat: Oropharynx is clear and moist and mucous membranes are normal.  Eyes: Conjunctivae and EOM are normal. Pupils are equal, round, and reactive to light.  Neck: Normal range of motion. Neck supple.  Cardiovascular: Regular rhythm, S1 normal and S2 normal.  Exam reveals no gallop and no friction rub.   No murmur heard. Pulmonary/Chest: Effort normal and breath sounds normal. No respiratory distress. She exhibits no tenderness.  Abdominal: Soft. Normal appearance and bowel sounds are normal. There is no hepatosplenomegaly. There is no tenderness. There is no rebound, no guarding, no tenderness at McBurney's point and negative Murphy's sign. No hernia.  Musculoskeletal: Normal range of motion.  Neurological: She  is alert and oriented to person, place, and time. She has normal strength. No cranial nerve deficit or sensory deficit. Coordination normal. GCS eye subscore is 4. GCS verbal subscore is 5. GCS motor subscore is 6.  Skin: Skin is warm, dry and intact. No rash noted. No cyanosis.  Psychiatric: She has a normal mood and affect. Her speech is normal and behavior is normal. Thought content normal.  Nursing note and vitals reviewed.   ED Course  Procedures (including critical care time) Labs Review Labs Reviewed  BASIC METABOLIC PANEL - Abnormal; Notable for the following:    Glucose, Bld 102 (*)    All other components within normal limits  CBC WITH DIFFERENTIAL/PLATELET  TROPONIN I  I-STAT TROPOININ, ED    Imaging Review Dg Chest 2 View  10/30/2015  CLINICAL DATA:  Left-sided chest pain radiating into the left upper extremity. Onset this morning. EXAM: CHEST  2 VIEW COMPARISON:  08/31/2007 FINDINGS: The heart size and mediastinal contours are within normal limits. Both lungs are clear. The visualized skeletal structures are unremarkable. IMPRESSION: No active cardiopulmonary disease. Electronically Signed   By: Ellery Plunkaniel R Mitchell M.D.   On: 10/30/2015 05:28   I have personally reviewed and evaluated these images and lab results as part of my medical decision-making.   EKG Interpretation   Date/Time:  Monday October 30 2015 04:50:10 EDT Ventricular Rate:  67 PR Interval:  129 QRS Duration: 96 QT Interval:  432 QTC Calculation: 456 R Axis:   49 Text Interpretation:  Sinus rhythm ST depression in Lateral leads Minimal  ST elevation, anterior leads changes consistent with LVH No previous  tracing Reconfirmed by Ginevra Tacker  MD, Ger Nicks [phone]([id number]) on 10/30/2015  5:12:12 AM      MDM   Final diagnoses:  None  chest pain  Patient presents to the ER for evaluation of chest pain. Patient was expressing hot flash prior to onset of the symptoms. She then developed pain in the center of her  chest that she felt was like indigestion. This lasted for proximally 45 minutes total and then resolved. She was pain-free at arrival to the ER. Here in the ER she has had several episodes of discomfort in her chest that last 1-2 minutes. She has not had any further continuous chest pain.  EKG at arrival shows T-wave inversions and depressions laterally that are consistent with LVH. She does have electrical criteria for LVH as well. This does not indicate ischemia. Patient has had 2 troponins here in the ear that are negative. A second EKG was performed and is unchanged from the first. At this point I feel that the patient is very unlikely to be expressing cardiac chest pain can be discharged, follow-up with her primary doctor.     Gilda Creasehristopher J Avary Pitsenbarger, MD 10/30/15 [phone]

## 2015-10-30 NOTE — Discharge Instructions (Signed)
Nonspecific Chest Pain  °Chest pain can be caused by many different conditions. There is always a chance that your pain could be related to something serious, such as a heart attack or a blood clot in your lungs. Chest pain can also be caused by conditions that are not life-threatening. If you have chest pain, it is very important to follow up with your health care provider. °CAUSES  °Chest pain can be caused by: °· Heartburn. °· Pneumonia or bronchitis. °· Anxiety or stress. °· Inflammation around your heart (pericarditis) or lung (pleuritis or pleurisy). °· A blood clot in your lung. °· A collapsed lung (pneumothorax). It can develop suddenly on its own (spontaneous pneumothorax) or from trauma to the chest. °· Shingles infection (varicella-zoster virus). °· Heart attack. °· Damage to the bones, muscles, and cartilage that make up your chest wall. This can include: °¨ Bruised bones due to injury. °¨ Strained muscles or cartilage due to frequent or repeated coughing or overwork. °¨ Fracture to one or more ribs. °¨ Sore cartilage due to inflammation (costochondritis). °RISK FACTORS  °Risk factors for chest pain may include: °· Activities that increase your risk for trauma or injury to your chest. °· Respiratory infections or conditions that cause frequent coughing. °· Medical conditions or overeating that can cause heartburn. °· Heart disease or family history of heart disease. °· Conditions or health behaviors that increase your risk of developing a blood clot. °· Having had chicken pox (varicella zoster). °SIGNS AND SYMPTOMS °Chest pain can feel like: °· Burning or tingling on the surface of your chest or deep in your chest. °· Crushing, pressure, aching, or squeezing pain. °· Dull or sharp pain that is worse when you move, cough, or take a deep breath. °· Pain that is also felt in your back, neck, shoulder, or arm, or pain that spreads to any of these areas. °Your chest pain may come and go, or it may stay  constant. °DIAGNOSIS °Lab tests or other studies may be needed to find the cause of your pain. Your health care provider may have you take a test called an ambulatory ECG (electrocardiogram). An ECG records your heartbeat patterns at the time the test is performed. You may also have other tests, such as: °· Transthoracic echocardiogram (TTE). During echocardiography, sound waves are used to create a picture of all of the heart structures and to look at how blood flows through your heart. °· Transesophageal echocardiogram (TEE). This is a more advanced imaging test that obtains images from inside your body. It allows your health care provider to see your heart in finer detail. °· Cardiac monitoring. This allows your health care provider to monitor your heart rate and rhythm in real time. °· Holter monitor. This is a portable device that records your heartbeat and can help to diagnose abnormal heartbeats. It allows your health care provider to track your heart activity for several days, if needed. °· Stress tests. These can be done through exercise or by taking medicine that makes your heart beat more quickly. °· Blood tests. °· Imaging tests. °TREATMENT  °Your treatment depends on what is causing your chest pain. Treatment may include: °· Medicines. These may include: °¨ Acid blockers for heartburn. °¨ Anti-inflammatory medicine. °¨ Pain medicine for inflammatory conditions. °¨ Antibiotic medicine, if an infection is present. °¨ Medicines to dissolve blood clots. °¨ Medicines to treat coronary artery disease. °· Supportive care for conditions that do not require medicines. This may include: °¨ Resting. °¨ Applying heat   or cold packs to injured areas. °¨ Limiting activities until pain decreases. °HOME CARE INSTRUCTIONS °· If you were prescribed an antibiotic medicine, finish it all even if you start to feel better. °· Avoid any activities that bring on chest pain. °· Do not use any tobacco products, including  cigarettes, chewing tobacco, or electronic cigarettes. If you need help quitting, ask your health care provider. °· Do not drink alcohol. °· Take medicines only as directed by your health care provider. °· Keep all follow-up visits as directed by your health care provider. This is important. This includes any further testing if your chest pain does not go away. °· If heartburn is the cause for your chest pain, you may be told to keep your head raised (elevated) while sleeping. This reduces the chance that acid will go from your stomach into your esophagus. °· Make lifestyle changes as directed by your health care provider. These may include: °¨ Getting regular exercise. Ask your health care provider to suggest some activities that are safe for you. °¨ Eating a heart-healthy diet. A registered dietitian can help you to learn healthy eating options. °¨ Maintaining a healthy weight. °¨ Managing diabetes, if necessary. °¨ Reducing stress. °SEEK MEDICAL CARE IF: °· Your chest pain does not go away after treatment. °· You have a rash with blisters on your chest. °· You have a fever. °SEEK IMMEDIATE MEDICAL CARE IF:  °· Your chest pain is worse. °· You have an increasing cough, or you cough up blood. °· You have severe abdominal pain. °· You have severe weakness. °· You faint. °· You have chills. °· You have sudden, unexplained chest discomfort. °· You have sudden, unexplained discomfort in your arms, back, neck, or jaw. °· You have shortness of breath at any time. °· You suddenly start to sweat, or your skin gets clammy. °· You feel nauseous or you vomit. °· You suddenly feel light-headed or dizzy. °· Your heart begins to beat quickly, or it feels like it is skipping beats. °These symptoms may represent a serious problem that is an emergency. Do not wait to see if the symptoms will go away. Get medical help right away. Call your local emergency services (911 in the [place] Do not drive yourself to the hospital. °  °This  information is not intended to replace advice given to you by your health care provider. Make sure you discuss any questions you have with your health care provider. °  °Document Released: 05/08/2005 Document Revised: 08/19/2014 Document Reviewed: 03/04/2014 °[employer]vier Interactive Patient Education ©2016 [employer] ° °

## 2015-10-30 NOTE — ED Notes (Signed)
Pt c/o left side chest pain that radiates to neck, left shoulder and left arm that started this am, pt described the pain as "indigestion", states that she woke up having "a hot flash" then started having the pain, pt was given 324 mg aspirin en route by ems, on pt arrival to er pt pain free.

## 2016-10-18 ENCOUNTER — Emergency Department (HOSPITAL_COMMUNITY)
Admission: EM | Admit: 2016-10-18 | Discharge: 2016-10-18 | Disposition: A | Discharge status: Home / Self Care | Payer: 59 | Attending: Emergency Medicine | Admitting: Emergency Medicine

## 2016-10-18 ENCOUNTER — Emergency Department (HOSPITAL_COMMUNITY): Payer: 59

## 2016-10-18 ENCOUNTER — Encounter (HOSPITAL_COMMUNITY): Payer: Self-pay | Admitting: Cardiology

## 2016-10-18 DIAGNOSIS — R42 Dizziness and giddiness: Secondary | ICD-10-CM

## 2016-10-18 DIAGNOSIS — F17210 Nicotine dependence, cigarettes, uncomplicated: Secondary | ICD-10-CM | POA: Diagnosis not present

## 2016-10-18 DIAGNOSIS — E039 Hypothyroidism, unspecified: Secondary | ICD-10-CM | POA: Diagnosis not present

## 2016-10-18 DIAGNOSIS — I10 Essential (primary) hypertension: Secondary | ICD-10-CM | POA: Insufficient documentation

## 2016-10-18 DIAGNOSIS — Z79899 Other long term (current) drug therapy: Secondary | ICD-10-CM | POA: Diagnosis not present

## 2016-10-18 LAB — CBC WITH DIFFERENTIAL/PLATELET
BASOS ABS: 0.1 10*3/uL (ref 0.0–0.1)
BASOS PCT: 1 %
Eosinophils Absolute: 0.2 10*3/uL (ref 0.0–0.7)
Eosinophils Relative: 3 %
HEMATOCRIT: 40.2 % (ref 36.0–46.0)
Hemoglobin: 14.2 g/dL (ref 12.0–15.0)
Lymphocytes Relative: 22 %
Lymphs Abs: 1.8 10*3/uL (ref 0.7–4.0)
MCH: 30.3 pg (ref 26.0–34.0)
MCHC: 35.3 g/dL (ref 30.0–36.0)
MCV: 85.9 fL (ref 78.0–100.0)
MONOS PCT: 7 %
Monocytes Absolute: 0.5 10*3/uL (ref 0.1–1.0)
NEUTROS PCT: 67 %
Neutro Abs: 5.6 10*3/uL (ref 1.7–7.7)
Platelets: 312 10*3/uL (ref 150–400)
RBC: 4.68 MIL/uL (ref 3.87–5.11)
RDW: 12.5 % (ref 11.5–15.5)
WBC: 8.3 10*3/uL (ref 4.0–10.5)

## 2016-10-18 LAB — URINALYSIS, ROUTINE W REFLEX MICROSCOPIC
Bilirubin Urine: NEGATIVE
GLUCOSE, UA: NEGATIVE mg/dL
Hgb urine dipstick: NEGATIVE
KETONES UR: NEGATIVE mg/dL
Leukocytes, UA: NEGATIVE
Nitrite: NEGATIVE
PROTEIN: NEGATIVE mg/dL
Specific Gravity, Urine: 1.003 — ABNORMAL LOW (ref 1.005–1.030)
pH: 8 (ref 5.0–8.0)

## 2016-10-18 LAB — COMPREHENSIVE METABOLIC PANEL
ALBUMIN: 4.4 g/dL (ref 3.5–5.0)
ALT: 28 U/L (ref 14–54)
AST: 26 U/L (ref 15–41)
Alkaline Phosphatase: 91 U/L (ref 38–126)
Anion gap: 8 (ref 5–15)
BUN: 12 mg/dL (ref 6–20)
CALCIUM: 10.2 mg/dL (ref 8.9–10.3)
CO2: 27 mmol/L (ref 22–32)
CREATININE: 0.89 mg/dL (ref 0.44–1.00)
Chloride: 103 mmol/L (ref 101–111)
GFR calc Af Amer: >60 mL/min (ref >60)
GFR calc non Af Amer: >60 mL/min (ref >60)
GLUCOSE: 114 mg/dL — AB (ref 65–99)
POTASSIUM: 3.8 mmol/L (ref 3.5–5.1)
Sodium: 138 mmol/L (ref 135–145)
Total Bilirubin: 0.7 mg/dL (ref 0.3–1.2)
Total Protein: 7.5 g/dL (ref 6.5–8.1)

## 2016-10-18 MED ORDER — ONDANSETRON HCL 4 MG/2ML IJ SOLN
4.0000 mg | Freq: Once | INTRAMUSCULAR | Status: AC
  Administered 2016-10-18: 4 mg via INTRAVENOUS
  Filled 2016-10-18: qty 2

## 2016-10-18 MED ORDER — MECLIZINE HCL 12.5 MG PO TABS
25.0000 mg | ORAL_TABLET | Freq: Once | ORAL | Status: AC
  Administered 2016-10-18: 25 mg via ORAL
  Filled 2016-10-18: qty 2

## 2016-10-18 MED ORDER — MECLIZINE HCL 25 MG PO TABS: 25.0000 mg | ORAL_TABLET | Freq: Three times a day (TID) | ORAL | 0 refills | Status: DC | PRN

## 2016-10-18 MED ORDER — DIAZEPAM 5 MG PO TABS: 5.0000 mg | ORAL_TABLET | Freq: Two times a day (BID) | ORAL | 0 refills | Status: AC

## 2016-10-18 MED ORDER — LORAZEPAM 2 MG/ML IJ SOLN
1.0000 mg | Freq: Once | INTRAMUSCULAR | Status: AC
  Administered 2016-10-18: 1 mg via INTRAVENOUS
  Filled 2016-10-18: qty 1

## 2016-10-18 MED ORDER — SODIUM CHLORIDE 0.9 % IV SOLN
Freq: Once | INTRAVENOUS | Status: AC
  Administered 2016-10-18: 12:00:00 via INTRAVENOUS

## 2016-10-18 NOTE — ED Provider Notes (Signed)
[facility] Provider Note   CSN: [id number] Arrival date & time: 10/18/16  1048     History   Chief Complaint Chief Complaint  Patient presents with  . Dizziness    HPI Gina Lucero is a 56 y.o. female.  The history is provided by the patient. No language interpreter was used.  Dizziness  Quality:  Vertigo Severity:  Severe Onset quality:  Gradual Duration:  1 day Timing:  Constant Progression:  Worsening Chronicity:  New Context: head movement and standing up   Relieved by:  Nothing Worsened by:  Nothing Ineffective treatments:  Medication Risk factors: hx of vertigo   Risk factors: no multiple medications     Past Medical History:  Diagnosis Date  . Hypertension   . Hypothyroidism   . Thyroid disease     Patient Active Problem List   Diagnosis Date Noted  . Rectal pain 10/26/2013    Past Surgical History:  Procedure Laterality Date  . HEMORRHOID SURGERY N/A 10/27/2013   Procedure: Exam under anesthesia  removal of prolapsed hemorrhoid;  Surgeon: Adolph Pollack, MD;  Location: [facility];  Service: General;  Laterality: N/A;  . TONSILLECTOMY      OB History    No data available       Home Medications    Prior to Admission medications   Medication Sig Start Date End Date Taking? Authorizing Provider  acetaminophen (TYLENOL) 500 MG tablet Take 1,000 mg by mouth every 6 (six) hours as needed for moderate pain.   Yes Historical Provider, MD  acyclovir (ZOVIRAX) 200 MG capsule Take 200 mg by mouth daily as needed (fever blister).  10/18/13  Yes Historical Provider, MD  fluticasone (FLONASE) 50 MCG/ACT nasal spray Place 1 spray into both nostrils 2 (two) times daily. 10/09/16  Yes Historical Provider, MD  hydrochlorothiazide (HYDRODIURIL) 25 MG tablet  11/10/13  Yes Historical Provider, MD  ibuprofen (ADVIL,MOTRIN) 200 MG tablet Take 400 mg by mouth every 6 (six) hours as needed (pain).   Yes Historical Provider, MD  levothyroxine (SYNTHROID,  LEVOTHROID) 100 MCG tablet Take 100 mcg by mouth daily.   Yes Historical Provider, MD  ondansetron (ZOFRAN-ODT) 8 MG disintegrating tablet Take 1 tablet by mouth daily as needed for nausea/vomiting. 09/18/16  Yes Historical Provider, MD  polyethylene glycol (MIRALAX / GLYCOLAX) packet Take 17 g by mouth daily.   Yes Historical Provider, MD  pseudoephedrine (SUDAFED) 30 MG tablet Take 30 mg by mouth every 4 (four) hours as needed for congestion.   Yes Historical Provider, MD  Vitamin D, Ergocalciferol, (DRISDOL) 50000 UNITS CAPS Take 50,000 Units by mouth every 7 (seven) days. On saturdays   Yes Historical Provider, MD  diazepam (VALIUM) 5 MG tablet Take 1 tablet (5 mg total) by mouth 2 (two) times daily. 10/18/16   Elson Areas, PA-C  doxycycline (VIBRA-TABS) 100 MG tablet Take 100 mg by mouth 2 (two) times daily.    Historical Provider, MD  meclizine (ANTIVERT) 25 MG tablet Take 1 tablet (25 mg total) by mouth 3 (three) times daily as needed for dizziness. 10/18/16   Elson Areas, PA-C    Family History History reviewed. No pertinent family history.  Social History Social History  Substance Use Topics  . Smoking status: Current Every Day Smoker    Packs/day: 1.00    Years: 27.00    Types: Cigarettes  . Smokeless tobacco: Never Used  . Alcohol use Yes     Comment: seldom  Allergies   Demerol [meperidine]; Penicillins; and Sulfa antibiotics   Review of Systems Review of Systems  Neurological: Positive for dizziness.  All other systems reviewed and are negative.    Physical Exam Updated Vital Signs BP 133/81 (BP Location: Left Arm)   Pulse 68   Temp 97.5 F (36.4 C) (Oral)   Resp 16   Ht 5\' 4"  (1.626 m)   Wt 70.3 kg   LMP 10/10/2013   SpO2 99%   BMI 26.61 kg/m   Physical Exam  Constitutional: She appears well-developed and well-nourished. No distress.  HENT:  Head: Normocephalic and atraumatic.  Right Ear: External ear normal.  Left Ear: External ear normal.    Nose: Nose normal.  Mouth/Throat: Oropharynx is clear and moist.  Eyes: Conjunctivae are normal.  Neck: Neck supple.  Cardiovascular: Normal rate and regular rhythm.   No murmur heard. Pulmonary/Chest: Effort normal and breath sounds normal. No respiratory distress.  Abdominal: Soft. There is no tenderness.  Musculoskeletal: She exhibits no edema.  Neurological: She is alert.  Skin: Skin is warm and dry.  Psychiatric: She has a normal mood and affect.  Nursing note and vitals reviewed.    ED Treatments / Results  Labs (all labs ordered are listed, but only abnormal results are displayed) Labs Reviewed  COMPREHENSIVE METABOLIC PANEL - Abnormal; Notable for the following:       Result Value   Glucose, Bld 114 (*)    All other components within normal limits  URINALYSIS, ROUTINE W REFLEX MICROSCOPIC - Abnormal; Notable for the following:    Color, Urine STRAW (*)    Specific Gravity, Urine 1.003 (*)    All other components within normal limits  CBC WITH DIFFERENTIAL/PLATELET    EKG  EKG Interpretation None       Radiology Ct Head Wo Contrast  Result Date: 10/18/2016 CLINICAL DATA:  Dizziness. EXAM: CT HEAD WITHOUT CONTRAST TECHNIQUE: Contiguous axial images were obtained from the base of the skull through the vertex without intravenous contrast. COMPARISON:  None. FINDINGS: Brain: No evidence of acute infarction, hemorrhage, hydrocephalus, extra-axial collection or mass lesion/mass effect. Vascular: No hyperdense vessel or unexpected calcification. Skull: Normal. Negative for fracture or focal lesion. Sinuses/Orbits: No acute finding. Other: None. IMPRESSION: Normal head CT. Electronically Signed   By: Lupita Raider, M.D.   On: 10/18/2016 13:17    Procedures Procedures (including critical care time)  Medications Ordered in ED Medications  0.9 %  sodium chloride infusion ( Intravenous New Bag/Given 10/18/16 1208)  ondansetron (ZOFRAN) injection 4 mg (4 mg Intravenous  Given 10/18/16 1207)  LORazepam (ATIVAN) injection 1 mg (1 mg Intravenous Given 10/18/16 1207)  meclizine (ANTIVERT) tablet 25 mg (25 mg Oral Given 10/18/16 1208)     Initial Impression / Assessment and Plan / ED Course  I have reviewed the triage vital signs and the nursing notes.  Pertinent labs & imaging results that were available during my care of the patient were reviewed by me and considered in my medical decision making (see chart for details).     Pt feels better after medications.  Pt referred to Dr. Suszanne Conners.  Pt reports her sister has vertigo and is followed by ENT.    Final Clinical Impressions(s) / ED Diagnoses   Final diagnoses:  Vertigo    New Prescriptions New Prescriptions   DIAZEPAM (VALIUM) 5 MG TABLET    Take 1 tablet (5 mg total) by mouth 2 (two) times daily.   MECLIZINE (ANTIVERT) 25  MG TABLET    Take 1 tablet (25 mg total) by mouth 3 (three) times daily as needed for dizziness.  An After Visit Summary was printed and given to the patient.    Lonia SkinnerLeslie K LeonidasSofia, PA-C 10/18/16 1352    Bethann BerkshireJoseph Zammit, MD 10/18/16 [phone]

## 2016-10-18 NOTE — ED Triage Notes (Signed)
Dizziness this morning  while getting ready for work   Took meclizine at home and thinks she vomited it up.

## 2018-01-12 ENCOUNTER — Other Ambulatory Visit: Payer: Self-pay | Admitting: Family Medicine

## 2018-01-12 ENCOUNTER — Ambulatory Visit
Admission: RE | Admit: 2018-01-12 | Discharge: 2018-01-12 | Disposition: A | Discharge status: Home / Self Care | Payer: 59 | Source: Ambulatory Visit | Attending: Family Medicine | Admitting: Family Medicine

## 2018-01-12 DIAGNOSIS — M533 Sacrococcygeal disorders, not elsewhere classified: Secondary | ICD-10-CM

## 2018-03-26 ENCOUNTER — Other Ambulatory Visit: Payer: Self-pay | Admitting: Family Medicine

## 2018-03-26 DIAGNOSIS — M533 Sacrococcygeal disorders, not elsewhere classified: Secondary | ICD-10-CM

## 2018-04-02 ENCOUNTER — Other Ambulatory Visit: Payer: 59

## 2019-09-03 IMAGING — CR DG SACRUM/COCCYX 2+V
3 series · 3 of 3 positions shown · non-contrast
Comparison: None.

CLINICAL DATA: Coccyx pain

EXAM:
SACRUM AND COCCYX - 2+ VIEW

[w sacrum ap]
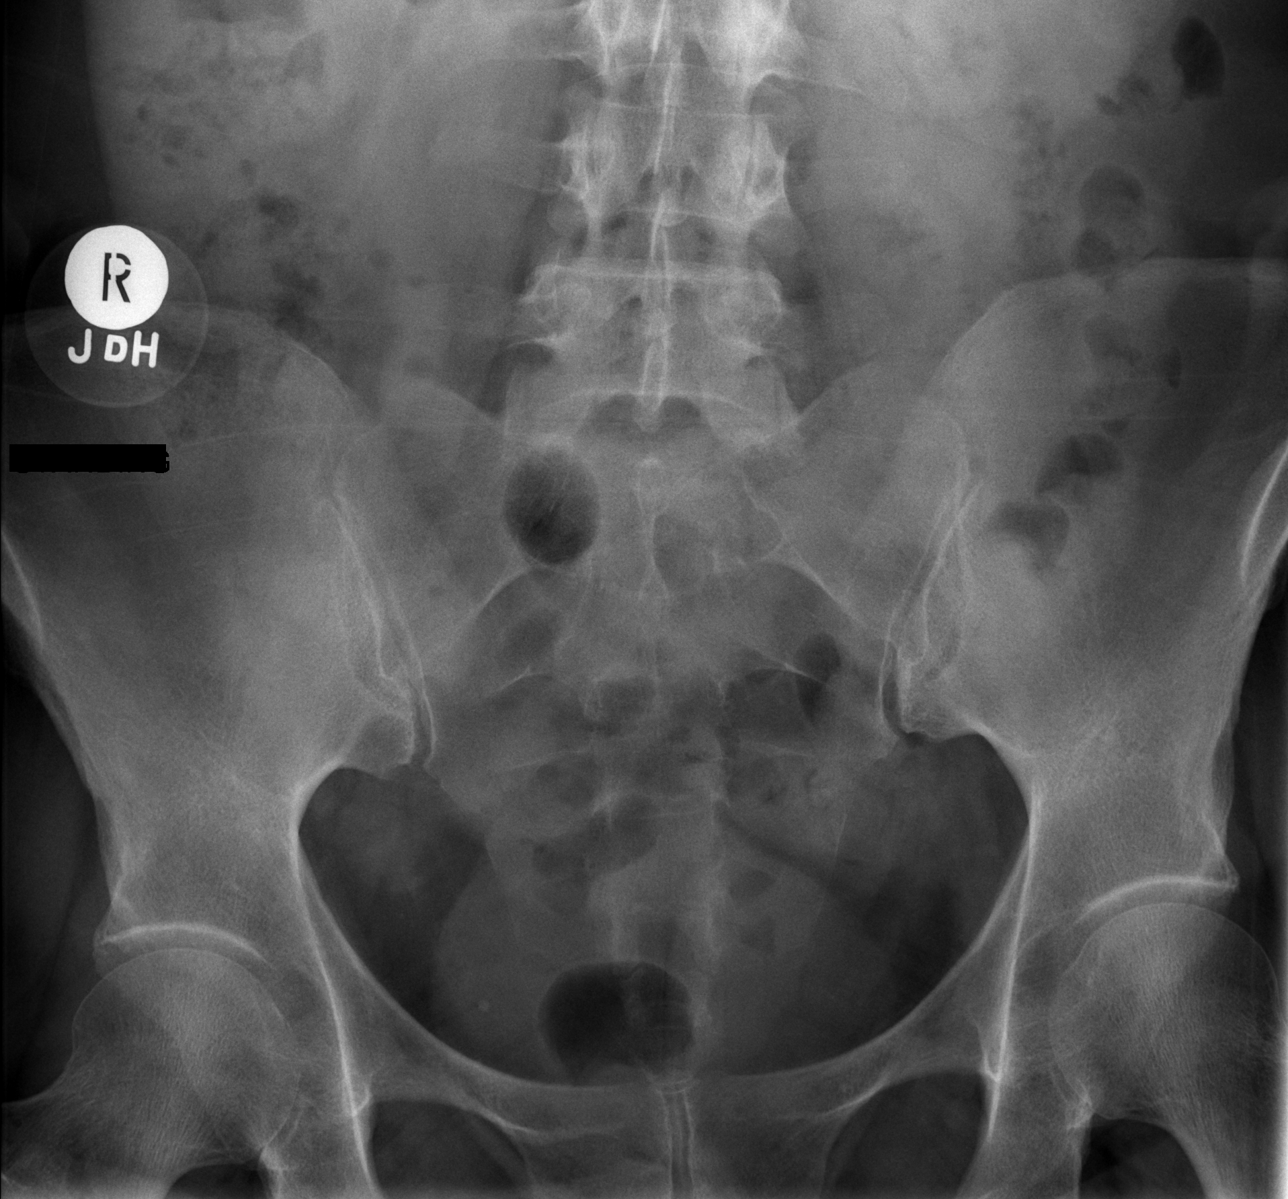

[w coccyx ap]
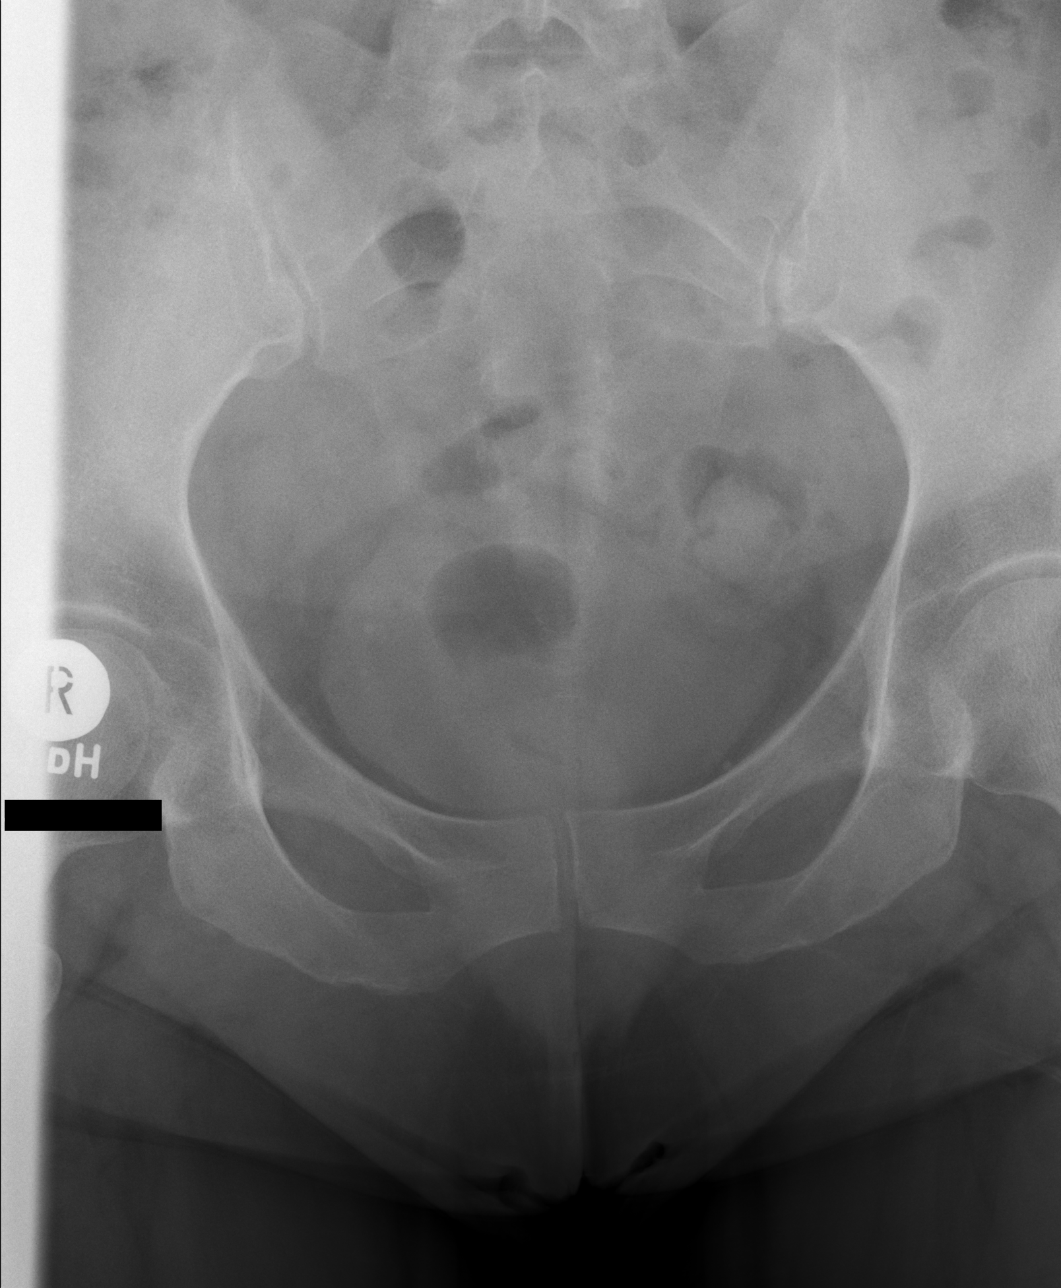

[w sacrum coccyx lat]
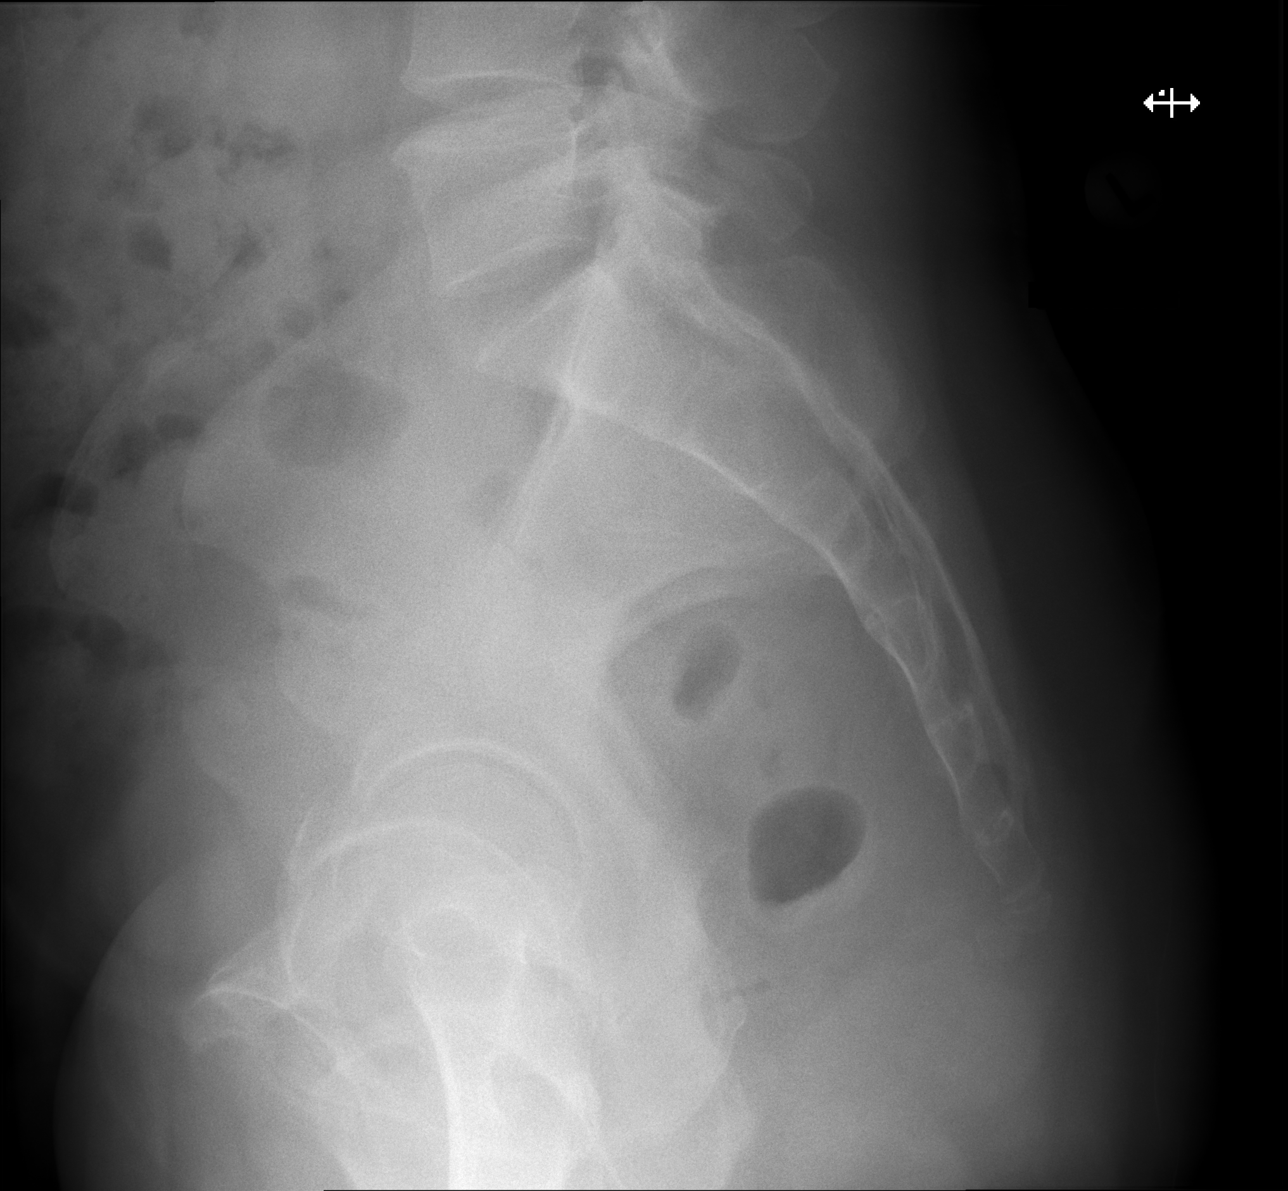

[3 of 3 positions shown; findings below may reference images not displayed]

FINDINGS: There is no evidence of fracture or other focal bone lesions. SI
joints are symmetric and unremarkable.
IMPRESSION: Negative.

## 2020-03-19 ENCOUNTER — Ambulatory Visit
Admission: EM | Admit: 2020-03-19 | Discharge: 2020-03-19 | Disposition: A | Discharge status: Home / Self Care | Payer: BC Managed Care – PPO | Attending: Emergency Medicine | Admitting: Emergency Medicine

## 2020-03-19 ENCOUNTER — Other Ambulatory Visit: Payer: Self-pay

## 2020-03-19 DIAGNOSIS — M545 Low back pain, unspecified: Secondary | ICD-10-CM

## 2020-03-19 MED ORDER — CYCLOBENZAPRINE HCL 10 MG PO TABS: 10.0000 mg | ORAL_TABLET | Freq: Every day | ORAL | 0 refills | Status: AC

## 2020-03-19 NOTE — Discharge Instructions (Signed)
Continue conservative management of rest, ice, heat, and gentle stretches/ massage Use meloxicam as prescribed Take cyclobenzaprine at nighttime for symptomatic relief. Avoid driving or operating heavy machinery while using medication. Follow up with PCP if symptoms persist Return or go to the ER if you have any new or worsening symptoms (fever, chills, chest pain, abdominal pain, changes in bowel or bladder habits, pain radiating into lower legs, etc...)

## 2020-03-19 NOTE — ED Provider Notes (Signed)
[facility]   [id number] 03/19/20 Arrival Time: 0957  CC: Back PAIN  SUBJECTIVE: History from: patient. Gina Lucero is a 59 y.o. female complains of RT low back pain that began this morning.  Symptoms began after leaning forward.  Localizes the pain to the RT low back.  Describes the pain as intermittent and "as if something needs to pop" in character.  6/10.  Has NOT tried OTC medications.  Symptoms are made worse with movement.  Denies similar symptoms in the past.  Denies fever, chills, erythema, ecchymosis, effusion, weakness, numbness and tingling, saddle paresthesias, loss of bowel or bladder function.      ROS: As per HPI.  All other pertinent ROS negative.     Past Medical History:  Diagnosis Date  . Hypertension   . Hypothyroidism   . Thyroid disease    Past Surgical History:  Procedure Laterality Date  . HEMORRHOID SURGERY N/A 10/27/2013   Procedure: Exam under anesthesia  removal of prolapsed hemorrhoid;  Surgeon: Adolph Pollack, MD;  Location: [facility];  Service: General;  Laterality: N/A;  . TONSILLECTOMY     Allergies  Allergen Reactions  . Demerol [Meperidine] Nausea And Vomiting  . Penicillins Rash    Has patient had a PCN reaction causing immediate rash, facial/tongue/throat swelling, SOB or lightheadedness with hypotension: no Has patient had a PCN reaction causing severe rash involving mucus membranes or skin necrosis: unknown Has patient had a PCN reaction that required hospitalization: no Has patient had a PCN reaction occurring within the last 10 years: no If all of the above answers are "NO", then may proceed with Cephalosporin use.   . Sulfa Antibiotics Rash   No current facility-administered medications on file prior to encounter.   Current Outpatient Medications on File Prior to Encounter  Medication Sig Dispense Refill  . acetaminophen (TYLENOL) 500 MG tablet Take 1,000 mg by mouth every 6 (six) hours as needed for moderate pain.    Marland Kitchen  acyclovir (ZOVIRAX) 200 MG capsule Take 200 mg by mouth daily as needed (fever blister).     . diazepam (VALIUM) 5 MG tablet Take 1 tablet (5 mg total) by mouth 2 (two) times daily. 10 tablet 0  . doxycycline (VIBRA-TABS) 100 MG tablet Take 100 mg by mouth 2 (two) times daily.    . fluticasone (FLONASE) 50 MCG/ACT nasal spray Place 1 spray into both nostrils 2 (two) times daily.    . hydrochlorothiazide (HYDRODIURIL) 25 MG tablet     . ibuprofen (ADVIL,MOTRIN) 200 MG tablet Take 400 mg by mouth every 6 (six) hours as needed (pain).    Marland Kitchen levothyroxine (SYNTHROID, LEVOTHROID) 100 MCG tablet Take 100 mcg by mouth daily.    . meclizine (ANTIVERT) 25 MG tablet Take 1 tablet (25 mg total) by mouth 3 (three) times daily as needed for dizziness. 30 tablet 0  . ondansetron (ZOFRAN-ODT) 8 MG disintegrating tablet Take 1 tablet by mouth daily as needed for nausea/vomiting.    . polyethylene glycol (MIRALAX / GLYCOLAX) packet Take 17 g by mouth daily.    . pseudoephedrine (SUDAFED) 30 MG tablet Take 30 mg by mouth every 4 (four) hours as needed for congestion.    . Vitamin D, Ergocalciferol, (DRISDOL) 50000 UNITS CAPS Take 50,000 Units by mouth every 7 (seven) days. On saturdays     Social History   Socioeconomic History  . Marital status: Married    Spouse name: Not on file  . Number of children: Not on  file  . Years of education: Not on file  . Highest education level: Not on file  Occupational History  . Not on file  Tobacco Use  . Smoking status: Current Every Day Smoker    Packs/day: 1.00    Years: 27.00    Pack years: 27.00    Types: Cigarettes  . Smokeless tobacco: Never Used  Substance and Sexual Activity  . Alcohol use: Yes    Comment: seldom  . Drug use: No  . Sexual activity: Not on file  Other Topics Concern  . Not on file  Social History Narrative  . Not on file   Social Determinants of Health   Financial Resource Strain:   . Difficulty of Paying Living Expenses:   Food  Insecurity:   . Worried About Programme researcher, broadcasting/film/video in the Last Year:   . Barista in the Last Year:   Transportation Needs:   . Freight forwarder (Medical):   Marland Kitchen Lack of Transportation (Non-Medical):   Physical Activity:   . Days of Exercise per Week:   . Minutes of Exercise per Session:   Stress:   . Feeling of Stress :   Social Connections:   . Frequency of Communication with Friends and Family:   . Frequency of Social Gatherings with Friends and Family:   . Attends Religious Services:   . Active Member of Clubs or Organizations:   . Attends Banker Meetings:   Marland Kitchen Marital Status:   Intimate Partner Violence:   . Fear of Current or Ex-Partner:   . Emotionally Abused:   Marland Kitchen Physically Abused:   . Sexually Abused:    No family history on file.  OBJECTIVE:  Vitals:   03/19/20 1024  BP: 114/75  Pulse: 76  Resp: 20  Temp: 98.2 F (36.8 C)  SpO2: 97%    General appearance: ALERT; appears uncomfortable, standing upon entering the room Head: NCAT Lungs: Normal respiratory effort Musculoskeletal: Back  Inspection: Skin warm, dry, clear and intact without obvious erythema, effusion, or ecchymosis.  Palpation: TTP over RT lower paravertebral muscles ROM: FROM active and passive Strength: 5/5 shld abduction, 5/5 shld adduction, 5/5 elbow flexion, 5/5 elbow extension, 5/5 grip strength, 5/5 hip flexion, 5/5 hip extension Skin: warm and dry Neurologic: Ambulates without difficulty; Sensation intact about the upper/ lower extremities Psychological: alert and cooperative; normal mood and affect   ASSESSMENT & PLAN:  1. Acute right-sided low back pain without sciatica     Meds ordered this encounter  Medications  . cyclobenzaprine (FLEXERIL) 10 MG tablet    Sig: Take 1 tablet (10 mg total) by mouth at bedtime.    Dispense:  15 tablet    Refill:  0    Order Specific Question:   Supervising Provider    Answer:   Eustace Moore [[id number]]     Continue conservative management of rest, ice, heat, and gentle stretches/ massage Use meloxicam as prescribed Take cyclobenzaprine at nighttime for symptomatic relief. Avoid driving or operating heavy machinery while using medication. Follow up with PCP if symptoms persist Return or go to the ER if you have any new or worsening symptoms (fever, chills, chest pain, abdominal pain, changes in bowel or bladder habits, pain radiating into lower legs, etc...)    Reviewed expectations re: course of current medical issues. Questions answered. Outlined signs and symptoms indicating need for more acute intervention. Patient verbalized understanding. After Visit Summary given.    Alvino Chapel, [place], PA-C  03/19/20 1052  

## 2020-03-19 NOTE — ED Triage Notes (Signed)
Pt presents with c/o right lower back pain after bending over this morning

## 2021-03-24 ENCOUNTER — Ambulatory Visit
Admission: EM | Admit: 2021-03-24 | Discharge: 2021-03-24 | Disposition: A | Discharge status: Home / Self Care | Payer: BC Managed Care – PPO | Attending: Emergency Medicine | Admitting: Emergency Medicine

## 2021-03-24 DIAGNOSIS — B029 Zoster without complications: Secondary | ICD-10-CM

## 2021-03-24 MED ORDER — PREDNISONE 10 MG (21) PO TBPK: ORAL_TABLET | Freq: Every day | ORAL | 0 refills | Status: AC

## 2021-03-24 MED ORDER — VALACYCLOVIR HCL 1 G PO TABS: 1000.0000 mg | ORAL_TABLET | Freq: Three times a day (TID) | ORAL | 0 refills | Status: AC

## 2021-03-24 NOTE — ED Provider Notes (Signed)
[facility]   [id number] 03/24/21 Arrival Time: 0824  CC: Rash  SUBJECTIVE:  Gina Lucero is a 60 y.o. female who presents with a rash to LT low back x few days.  Denies injury or precipitating event.  Localizes the rash to LT low back.  Describes it as itchy and red.  Has tried OTC medication without relief.  Symptoms are made worse with scratching.  Denies fever, chills, nausea, vomiting, erythema, swelling, discharge, oral lesions, SOB, chest pain.  ROS: As per HPI.  All other pertinent ROS negative.     Past Medical History:  Diagnosis Date   Hypertension    Hypothyroidism    Thyroid disease    Past Surgical History:  Procedure Laterality Date   HEMORRHOID SURGERY N/A 10/27/2013   Procedure: Exam under anesthesia  removal of prolapsed hemorrhoid;  Surgeon: Adolph Pollack, MD;  Location: [facility];  Service: General;  Laterality: N/A;   TONSILLECTOMY     Allergies  Allergen Reactions   Demerol [Meperidine] Nausea And Vomiting   Penicillins Rash    Has patient had a PCN reaction causing immediate rash, facial/tongue/throat swelling, SOB or lightheadedness with hypotension: no Has patient had a PCN reaction causing severe rash involving mucus membranes or skin necrosis: unknown Has patient had a PCN reaction that required hospitalization: no Has patient had a PCN reaction occurring within the last 10 years: no If all of the above answers are "NO", then may proceed with Cephalosporin use.    Sulfa Antibiotics Rash   No current facility-administered medications on file prior to encounter.   Current Outpatient Medications on File Prior to Encounter  Medication Sig Dispense Refill   acetaminophen (TYLENOL) 500 MG tablet Take 1,000 mg by mouth every 6 (six) hours as needed for moderate pain.     acyclovir (ZOVIRAX) 200 MG capsule Take 200 mg by mouth daily as needed (fever blister).      cyclobenzaprine (FLEXERIL) 10 MG tablet Take 1 tablet (10 mg total) by mouth at  bedtime. 15 tablet 0   diazepam (VALIUM) 5 MG tablet Take 1 tablet (5 mg total) by mouth 2 (two) times daily. 10 tablet 0   doxycycline (VIBRA-TABS) 100 MG tablet Take 100 mg by mouth 2 (two) times daily.     fluticasone (FLONASE) 50 MCG/ACT nasal spray Place 1 spray into both nostrils 2 (two) times daily.     hydrochlorothiazide (HYDRODIURIL) 25 MG tablet      ibuprofen (ADVIL,MOTRIN) 200 MG tablet Take 400 mg by mouth every 6 (six) hours as needed (pain).     levothyroxine (SYNTHROID, LEVOTHROID) 100 MCG tablet Take 100 mcg by mouth daily.     meclizine (ANTIVERT) 25 MG tablet Take 1 tablet (25 mg total) by mouth 3 (three) times daily as needed for dizziness. 30 tablet 0   ondansetron (ZOFRAN-ODT) 8 MG disintegrating tablet Take 1 tablet by mouth daily as needed for nausea/vomiting.     polyethylene glycol (MIRALAX / GLYCOLAX) packet Take 17 g by mouth daily.     pseudoephedrine (SUDAFED) 30 MG tablet Take 30 mg by mouth every 4 (four) hours as needed for congestion.     Vitamin D, Ergocalciferol, (DRISDOL) 50000 UNITS CAPS Take 50,000 Units by mouth every 7 (seven) days. On saturdays     Social History   Socioeconomic History   Marital status: Married    Spouse name: Not on file   Number of children: Not on file   Years of education: Not  on file   Highest education level: Not on file  Occupational History   Not on file  Tobacco Use   Smoking status: Every Day    Packs/day: 1.00    Years: 27.00    Pack years: 27.00    Types: Cigarettes   Smokeless tobacco: Never  Substance and Sexual Activity   Alcohol use: Yes    Comment: seldom   Drug use: No   Sexual activity: Not on file  Other Topics Concern   Not on file  Social History Narrative   Not on file   Social Determinants of Health   Financial Resource Strain: Not on file  Food Insecurity: Not on file  Transportation Needs: Not on file  Physical Activity: Not on file  Stress: Not on file  Social Connections: Not on  file  Intimate Partner Violence: Not on file   History reviewed. No pertinent family history.  OBJECTIVE: Vitals:   03/24/21 0832  BP: (!) 160/83  Pulse: 63  Resp: 16  Temp: 98.2 F (36.8 C)  TempSrc: Oral  SpO2: 98%    General appearance: alert; no distress Head: NCAT Lungs: normal respiratory effort Extremities: no edema Skin: warm and dry; Crops of erythematous papules over LT low back dermatome on the L1; some crusting  Psychological: alert and cooperative; normal mood and affect  ASSESSMENT & PLAN:  1. Herpes zoster without complication     Meds ordered this encounter  Medications   valACYclovir (VALTREX) 1000 MG tablet    Sig: Take 1 tablet (1,000 mg total) by mouth 3 (three) times daily for 10 days.    Dispense:  30 tablet    Refill:  0    Order Specific Question:   Supervising Provider    Answer:   Eustace Moore [[id number]]   predniSONE (STERAPRED UNI-PAK 21 TAB) 10 MG (21) TBPK tablet    Sig: Take by mouth daily. Take 6 tabs by mouth daily  for 2 days, then 5 tabs for 2 days, then 4 tabs for 2 days, then 3 tabs for 2 days, 2 tabs for 2 days, then 1 tab by mouth daily for 2 days    Dispense:  42 tablet    Refill:  0    Order Specific Question:   Supervising Provider    Answer:   Eustace Moore [[id number]]    Rest and use ice/heat as needed for symptomatic relief Prescribed valacyclovir 1000mg  3x/day for 10 days Prescribed prednisone taper for inflammation and pain Use OTC medications such as ibuprofen/ tylenol.   Follow up with PCP in 7-10 days if rash is still present Follow up with PCP if symptoms of burning, stinging, tingling or numbness occur after rash resolves, you may need additional treatment Return here or go to ER if you have any new or worsening symptoms (such as eye involvement, severe pain, or signs of secondary infection such as fever, chills, nausea, vomiting, discharge, redness or warmth over site of rash)   Reviewed expectations re:  course of current medical issues. Questions answered. Outlined signs and symptoms indicating need for more acute intervention. Patient verbalized understanding. After Visit Summary given.    9-10, PA-C 03/24/21 [phone]

## 2021-03-24 NOTE — Discharge Instructions (Addendum)
Rest and use ice/heat as needed for symptomatic relief Prescribed valacyclovir 1000mg 3x/day for 10 days Prescribed prednisone taper for inflammation and pain Use OTC medications such as ibuprofen/ tylenol.  Follow up with PCP in 7-10 days if rash is still present Follow up with PCP if symptoms of burning, stinging, tingling or numbness occur after rash resolves, you may need additional treatment Return here or go to ER if you have any new or worsening symptoms (such as eye involvement, severe pain, or signs of secondary infection such as fever, chills, nausea, vomiting, discharge, redness or warmth over site of rash) 

## 2021-03-24 NOTE — ED Triage Notes (Signed)
Pt present a rash on her left side of the back with some pain. Pt states she noticed the rash on Tuesday.

## 2023-01-16 DIAGNOSIS — H66003 Acute suppurative otitis media without spontaneous rupture of ear drum, bilateral: Secondary | ICD-10-CM | POA: Diagnosis not present

## 2023-01-16 DIAGNOSIS — J0140 Acute pansinusitis, unspecified: Secondary | ICD-10-CM | POA: Diagnosis not present

## 2023-05-23 DIAGNOSIS — Z1231 Encounter for screening mammogram for malignant neoplasm of breast: Secondary | ICD-10-CM | POA: Diagnosis not present

## 2023-06-18 DIAGNOSIS — R0981 Nasal congestion: Secondary | ICD-10-CM | POA: Diagnosis not present

## 2023-06-18 DIAGNOSIS — R062 Wheezing: Secondary | ICD-10-CM | POA: Diagnosis not present

## 2023-06-18 DIAGNOSIS — J0100 Acute maxillary sinusitis, unspecified: Secondary | ICD-10-CM | POA: Diagnosis not present

## 2023-06-18 DIAGNOSIS — Z1152 Encounter for screening for COVID-19: Secondary | ICD-10-CM | POA: Diagnosis not present

## 2023-06-18 DIAGNOSIS — R059 Cough, unspecified: Secondary | ICD-10-CM | POA: Diagnosis not present

## 2023-06-18 DIAGNOSIS — R058 Other specified cough: Secondary | ICD-10-CM | POA: Diagnosis not present

## 2023-07-02 DIAGNOSIS — Z23 Encounter for immunization: Secondary | ICD-10-CM | POA: Diagnosis not present

## 2023-07-02 DIAGNOSIS — I10 Essential (primary) hypertension: Secondary | ICD-10-CM | POA: Diagnosis not present

## 2023-07-02 DIAGNOSIS — Z13220 Encounter for screening for lipoid disorders: Secondary | ICD-10-CM | POA: Diagnosis not present

## 2023-07-02 DIAGNOSIS — E559 Vitamin D deficiency, unspecified: Secondary | ICD-10-CM | POA: Diagnosis not present

## 2023-07-02 DIAGNOSIS — Z0000 Encounter for general adult medical examination without abnormal findings: Secondary | ICD-10-CM | POA: Diagnosis not present

## 2023-07-02 DIAGNOSIS — E039 Hypothyroidism, unspecified: Secondary | ICD-10-CM | POA: Diagnosis not present

## 2023-07-02 DIAGNOSIS — I422 Other hypertrophic cardiomyopathy: Secondary | ICD-10-CM | POA: Diagnosis not present

## 2023-07-02 NOTE — Progress Notes (Signed)
 HPI   Gina Lucero presents today for complete physical exam. And medication check  Health maintenance reviewed and updated. 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 And recently cardiologist added Losartan ; her sister has this diagnosis and other members of her family will be tested ( both sisters have this issue) - she will be scheduled for cardiac MRI  May see Dr Cyrena [facility] ( sister sees this provider )         Hypothyroidism - she is doing well on current medication - her TSH was in a good range last visit will need refill        Hypertension- she is doing well with her Losartan /Diltiazem -  her blood pressures are doing well she will need a refill of her medication -patient is not exercising  BP Readings from Last 3 Encounters:  07/02/23 130/82  06/18/23 153/80  01/16/23 140/78        Patient has a history of Vertigo- she likes to have Meclizine  on hand for this issue - she does use a nasal spray and this helps and Claritin    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 hand on hand for this issue    Anxiety- she like to   have Diazepam  on hand - this does help with dizziness in the past    Vitamin D Deficiency -patient is taking weekly vitamin d -has missed a few - she has a history- she is taking every other week    Hemorrhoids - patient has ongoing constipation and has some increase in pressure in rectum -when she has bowel changes she has more pressure - -she has proctocort  on hand and needs a refill    Hyperlipidemia-patient is due for repeat blood work today she may consider starting cholesterol medicine at this point will wait on lab results before initiating treatment. 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 Patient denies any injury for hip discomfort.  Review of Systems   12 point systems were reviewed and negative except for as noted above.  Medical History   @HEALTHMAINTENANCE @ Past Medical History:  Diagnosis Date  . Allergic rhinitis   . Chronic constipation   . H/O hemorrhoidectomy   . Headache   . Hearing loss   . Hx of colonic polyps   . Hypertension   . Hypothyroidism   . Thyroid disease    Past Surgical History:  Procedure Laterality Date  . HEMORRHOID SURGERY     Procedure: HEMORRHOID SURGERY; twice and I and D at least once   . RECTAL SURGERY     Procedure: RECTAL SURGERY  . TONSILLECTOMY     Procedure: TONSILLECTOMY   Family History  Problem Relation Name Age of Onset  . Atrial fibrillation Mother    . Arthritis Mother    . Hyperlipidemia Mother    . Hypertension Mother    . Hearing loss Mother     . Cancer Father         lung  . Hyperlipidemia Father    . Hearing loss Father    . Hypertension Father    . Diabetes Father    . Cancer Sister         kidney  . Hearing loss Sister    . Diabetes Brother    . Hypertension Brother    . Stroke Maternal Grandmother    . Stroke Paternal Grandmother    . Cancer Paternal Grandfather         colon  . Diabetes Paternal Grandfather    . Diabetes Cousin    . Allergies Son    . Allergies Daughter    . Cardiomyopathy Sister kelly   . Thyroid disease Neg Hx     Social History   Social History Narrative  . Not on file   Allergies  Allergen Reactions  . Meperidine GI Intolerance  . Tobramycin Other (See Comments)    Swelling in eyes.  SABRA Penicillins Rash  . Sulfamethoxazole Rash    Immunization History  Administered Date(s) Administered  . Influenza, Injectable, Quadrivalent, Preservative Free 06/17/2019, 06/20/2020, 06/21/2021  . Influenza, Recombinant, Quadrivalent, Injectable, Preservative Free (Egg Free) 06/12/2022  . Influenza, split virus, trivalent, preservative 06/26/2016  . Moderna SARS-CoV-2 Bivalent Booster 6+ yrs 07/25/2021  . Moderna SARS-CoV-2 Primary Series 12+ yrs 10/21/2019, 11/20/2019, 07/18/2020  . Tdap 06/12/2022    Physical Exam   Vitals:   07/02/23 0833  BP: 130/82  Pulse: 71  Resp: 16  Temp: 98 F (36.7 C)  SpO2: 99%   General Appearance:    Alert, cooperative, no distress, appears stated age  Head:    Normocephalic, without obvious abnormality, atraumatic  Eyes:     conjunctiva clear,     Ears:    Normal TM's and external ear canals, both ears  Nose:  septum midline,   Throat:   Lips, mucosa, and tongue normal; teeth and gums   normal  Neck:   Supple, symmetrical, trachea midline, no adenopathy;       thyroid:  no enlargement/tenderness/nodules;   Back:     Symmetric,   Lungs:     Clear to auscultation bilaterally, no wheezes or crackles,   respirations unlabored     Heart:    Regular rate  and rhythm, S1 and S2 normal, no murmur, rub or gallop  Abdomen:     Soft, non-tender,    no masses, no organomegaly  Extremities:   Extremities normal, atraumatic, no cyanosis or edema   GU: large fleshy hemorrhoids present; tenderness of the pilonidal tract no erythema  or swelling   Skin:   Skin color, texture, turgor normal, no rashes or lesions  Lymph nodes:   Cervical,  nodes normal  Neurologic:   Normal appearing strength, At baseline. No focal deficit.  I have personally spent 46 minutes involved in face-to-face and non-face-to-face activities for this patient on the day of the visit.  Professional time spent includes the following activities, in addition to those noted in the documentation:   Assessment/Plan   1. Annual physical exam  - CBC with Differential - Comprehensive Metabolic Panel - Lipid Panel  2. Screening for lipid disorders  - Lipid Panel  3. Acquired hypothyroidism  - TSH  4. Vitamin D deficiency, unspecified  - Vitamin D, 25-Hydroxy  5. Essential (primary) hypertension   6. Other hypertrophic cardiomyopathy (CMD)   7. Grade II hemorrhoids   8. Mixed hyperlipidemia   9. Encounter for immunization - Flu,Trivalent,IM, Preservative Free   Outpatient Encounter Medications as of 07/02/2023  Medication Sig Dispense Refill  . bifidobacterium infantis (Align) 4 mg capsule Take 1 tablet by mouth.    . dilTIAZem (TIAZAC) 240 mg Cs24 24 hr capsule Take 1 capsule by mouth daily. 90 capsule 0  . docusate sodium (COLACE) 100 mg capsule Take 1 tablet by mouth.    . ergocalciferol (VITAMIN D2) 1,250 mcg (50,000 unit) capsule Take 1 capsule by mouth once weekly. 13 capsule 0  . fluticasone propionate (FLONASE) 50 mcg/spray nasal spray USE 2 SPRAYS IN EACH NOSTRIL DAILY 48 g 3  . hydrocortisone  acetate 30 mg supp Insert 30 mg into the rectum 2 (two) times a day as needed (hemorrhoids). 28 each 11  . levothyroxine (SYNTHROID) 100 mcg tablet Take 1 tablet by  mouth daily. 90 tablet 0  . loratadine (CLARITIN) 10 mg tablet Take 10 mg by mouth Once Daily.    SABRA losartan (COZAAR) 50 mg tablet Take 1 tablet by mouth daily. 90 tablet 0  . meclizine  (ANTIVERT ) 25 mg chew chew tablet Take 25 mg by mouth 3 (three) times a day as needed.    . meloxicam (MOBIC) 15 mg tablet Take 15 mg by mouth Once Daily. 90 tablet 3  . polyethylene glycol (GLYCOLAX) 17 gram packet Take 17 g by mouth Once Daily.    . valACYclovir  (VALTREX ) 1 gram tablet Take 2 tablets by mouth and repeat in 12 hours. 30 tablet 3  . [DISCONTINUED] benzonatate (Tessalon Perles) 100 mg capsule Take 1- 2 pills up to three times per day for cough 60 capsule 1  . [DISCONTINUED] hydrocortisone  2.5 % ointment Please avoid application to genital region as it may cause thinning of skin 60 g 3   No facility-administered encounter medications on file as of 07/02/2023.  Will check blood work and message with results. You were given a flu shot and please consider getting the shingles vaccine( shingrix) Call GI to discuss next follow-up date for colonoscopy. Continue with yearly mammogram.  Hypertrophic cardiomyopathy-continue with diltiazem and continue to follow-up with cardiology as planned.  Hypertension-your blood pressures in good control today continue with losartan.  Hyperlipidemia-discussed the benefit of a statin due to having hypertension-will check lipid panel today and make recommendations once labs have returned.  Hemorrhoids/pilonidal cyst-discussed the benefit of Epsom salt soaks okay to monitor these issues for now as the pilonidal cyst is not inflamed or infected also refilled hydrocortisone  cream to use as needed.  Hypothyroidism-will check TSH level today continue with current dosage of levothyroxine this medication has been refilled.  Age  appropriate wellness instructions reviewed and provided in AVS.  Risks, benefits, and alternatives of the medications and treatment plan prescribed  today were discussed, and patient expressed understanding. Plan follow-up as discussed or as needed if any worsening symptoms or change in condition.  Medication list given to patient.  No barriers to learning identified.    Electronically signed by: Kristen Diane Kaplan, PA-C 07/02/2023 9:07 AM

## 2023-07-17 DIAGNOSIS — E039 Hypothyroidism, unspecified: Secondary | ICD-10-CM | POA: Diagnosis not present

## 2023-08-22 DIAGNOSIS — R051 Acute cough: Secondary | ICD-10-CM | POA: Diagnosis not present

## 2023-08-22 DIAGNOSIS — J209 Acute bronchitis, unspecified: Secondary | ICD-10-CM | POA: Diagnosis not present

## 2023-08-22 DIAGNOSIS — R0981 Nasal congestion: Secondary | ICD-10-CM | POA: Diagnosis not present

## 2023-08-22 DIAGNOSIS — Z1152 Encounter for screening for COVID-19: Secondary | ICD-10-CM | POA: Diagnosis not present

## 2023-09-22 DIAGNOSIS — E039 Hypothyroidism, unspecified: Secondary | ICD-10-CM | POA: Diagnosis not present

## 2023-10-20 DIAGNOSIS — E039 Hypothyroidism, unspecified: Secondary | ICD-10-CM | POA: Diagnosis not present

## 2023-11-24 DIAGNOSIS — E039 Hypothyroidism, unspecified: Secondary | ICD-10-CM | POA: Diagnosis not present

## 2024-02-02 NOTE — Telephone Encounter (Signed)
 Ordered labs she can schedule here or walk into [address] or [facility]

## 2024-02-03 DIAGNOSIS — E039 Hypothyroidism, unspecified: Secondary | ICD-10-CM | POA: Diagnosis not present

## 2024-02-16 DIAGNOSIS — R051 Acute cough: Secondary | ICD-10-CM | POA: Diagnosis not present

## 2024-02-16 DIAGNOSIS — J209 Acute bronchitis, unspecified: Secondary | ICD-10-CM | POA: Diagnosis not present

## 2024-02-17 DIAGNOSIS — R059 Cough, unspecified: Secondary | ICD-10-CM | POA: Diagnosis not present

## 2024-02-17 DIAGNOSIS — R0602 Shortness of breath: Secondary | ICD-10-CM | POA: Diagnosis not present

## 2024-02-17 DIAGNOSIS — J40 Bronchitis, not specified as acute or chronic: Secondary | ICD-10-CM | POA: Diagnosis not present

## 2024-02-17 DIAGNOSIS — R918 Other nonspecific abnormal finding of lung field: Secondary | ICD-10-CM | POA: Diagnosis not present

## 2024-02-17 DIAGNOSIS — R509 Fever, unspecified: Secondary | ICD-10-CM | POA: Diagnosis not present

## 2024-02-17 DIAGNOSIS — F17200 Nicotine dependence, unspecified, uncomplicated: Secondary | ICD-10-CM | POA: Diagnosis not present

## 2024-02-17 DIAGNOSIS — I10 Essential (primary) hypertension: Secondary | ICD-10-CM | POA: Diagnosis not present

## 2024-02-17 DIAGNOSIS — J209 Acute bronchitis, unspecified: Secondary | ICD-10-CM | POA: Diagnosis not present

## 2024-02-17 DIAGNOSIS — R0989 Other specified symptoms and signs involving the circulatory and respiratory systems: Secondary | ICD-10-CM | POA: Diagnosis not present

## 2024-02-18 DIAGNOSIS — R0602 Shortness of breath: Secondary | ICD-10-CM | POA: Diagnosis not present

## 2024-02-18 DIAGNOSIS — R918 Other nonspecific abnormal finding of lung field: Secondary | ICD-10-CM | POA: Diagnosis not present

## 2024-02-18 NOTE — ED Provider Notes (Signed)
 ------------------------------------------------------------------------------- Attestation signed by Lynwood DELENA Phenix, MD at 02/18/24 [phone] I have reviewed and agree with the APP's findings and plan for this patient. Lynwood DELENA Phenix, MD Emergency Department - 02/18/2024 6:33 AM -------------------------------------------------------------------------------   [facility]  ED Provider Note  Gina Lucero 63 y.o. female DOB: May 28, 1961 MRN: [id number] History   Chief Complaint  Patient presents with  . Cough    Pt was seen by urgent care on Monday and diagnosed with bronchitis and given a z-pack and a shot of steroids and a cough medication for night time, pt sattes that she just can't get over the coughing and she can't get her breath and states she cannot sleep, pt was neg for COVID and FLU   63 year old female presents to the ED with complaints of dry cough since Thursday.  Patient states that she watches her grandchild during the week, states that he had a runny nose, she believes that he may have contracted what he had.  Patient states that on Thursday night she had a temperature of 101, and states that again she had a fever Friday night as well.  Patient states that she had a Z-Pak at home due to a history of recurrent bronchitis, states that she began taking this.  She states that she came into town on Monday for vacation, and was seen by urgent care who increased her Z-Pak dosage to 500 mg daily, prescribed cough syrup, and steroids.  Patient states that she has been hesitant to take cough syrup and steroids, and has not been taking them as prescribed.  She states that tonight she had a coughing episode that made difficult to breathe.  Patient states that she smokes a half a pack a day.  Patient denies chest pain, current fevers, nausea, vomiting.   History provided by:  Patient Language interpreter used: No        Past Medical History:  Diagnosis Date   . High cholesterol   . Hypertension   . Other hypertrophic cardiomyopathy (*)     Past Surgical History:  Procedure Laterality Date  . Tonsillectomy      Social History   Substance and Sexual Activity  Alcohol Use Not Currently   Tobacco Use History[1] E-Cigarettes  . Vaping Use Never User   . Start Date    . Cartridges/Day    . Quit Date     Social History   Substance and Sexual Activity  Drug Use Not Currently   Tetanus up to date?: Unknown Immunizations Up to Date?: Unknown   Allergies[2]  Discharge Medication List as of 02/18/2024  1:23 AM     CONTINUE these medications which have NOT CHANGED   Details  diltiazem (CARDIZEM) 120 MG tablet Take two tablets (240 mg dose) by mouth 4 (four) times daily., Historical Med    ergocalciferol 50,000 units CAPS capsule Take one capsule (50,000 Units dose) by mouth once a week at 0900., Historical Med    fluticasone propionate (FLONASE) 50 mcg/actuation nasal spray two sprays by Both Nostrils route daily., Historical Med    levothyroxine sodium (SYNTHROID,LEVOTHROID,LEVOXYL) 125 mcg tablet Take one tablet (125 mcg dose) by mouth daily at 6 (six) am., Historical Med    loratadine (CLARITIN) 10 MG tablet Take one tablet (10 mg dose) by mouth daily., Historical Med    losartan potassium (COZAAR) 50 mg tablet Take one tablet (50 mg dose) by mouth daily., Historical Med    rosuvastatin calcium (CRESTOR) 5 mg tablet  Take one tablet (5 mg dose) by mouth daily., Historical Med        Primary Survey  Primary Survey  Review of Systems   Review of Systems  Respiratory:  Positive for cough.     Physical Exam   ED Triage Vitals [02/17/24 2340]  BP (!) 156/76  Heart Rate 87  Resp 18  SpO2 97 %  Temp 98.2 F (36.8 C)    Physical Exam  Vitals reviewed. Constitutional: She appears well-developed and well-nourished. She no respiratory distress.  HENT:  Head: Normocephalic and atraumatic.  Eyes: Conjunctivae are  normal. Pupils are equal, round, and reactive to light.  Neck: Normal range of motion. No stridor.  Cardiovascular: Normal rate and regular rhythm.  Pulmonary/Chest: No respiratory distress. Respiratory effort normal. No wheezing.  Mild inspiratory anterior chest wheezing noted.  Abdominal: Soft. Abdomen not distended.  Musculoskeletal: Normal range of motion. No obvious deformity noted to extremities.     Cervical back: Normal range of motion.   Neurological: She is alert. She has normal speech.  Skin: Skin is warm. Skin is dry.  Psychiatric: She has a normal mood and affect. Her behavior is normal.     ED Course   Lab results: No data to display  Imaging:   XR CHEST AP PORTABLE   Narrative:    PROCEDURE: XR CHEST AP PORTABLE  INDICATION: Cough Shortness of breath  COMPARISON: None  FINDINGS: There appears to be some bronchiolitis change, minor asymmetric patchy opacity at the LEFT lung base. No consolidation.  Heart size upper normal.    Impression:    IMPRESSION: Asymmetric LEFT basilar opacity, possibly some bronchiolitis; could be infectious or inflammatory. No consolidation at this time.  Recommend follow-up to resolution, chest radiographs in 4-6 weeks or earlier as clinically warranted.  Electronically Signed by: Redell DELENA Brought on 02/18/2024 1:00 AM      ECG: ECG Results   None                                                                     Pre-Sedation Procedures    Medical Decision Making Patient seen and examined, vitals reviewed and are stable.  Sickle exam findings are overall reassuring.  Patient is oxygenating 97% on room air is in no respiratory distress.  Patient states that she was prescribed Phenergan  cough syrup which she has not been taking as prescribed because she was hesitant to.  She states that she has been taking 5mL, and states that this has been helping her symptoms.  I recommend that she  take the prescribed amount which is 10 mL.  The patient was also prescribed steroids by urgent care, she states that she has not been taking these either.  I recommend that she start taking these to help with inflammation and symptoms.  Advised that she continue azithromycin, however this sounds viral currently.  Chest x-ray demonstrates asymmetric left basilar opacity, possibly some bronchiolitis.  Could be infectious or inflammatory.  No consolidation at this time.  Discussed chest x-ray findings with the patient.  She was given a dose of benzonatate in the ED, and benzonatate was also sent to her pharmacy.  I recommend that she begin taking prednisone  and take syrup as prescribed, since  this does help with her symptoms.  Strict return precautions were discussed and she verbalized understanding.  Discharged in stable condition.  Risk Prescription drug management.           Provider Communication  Discharge Medication List as of 02/18/2024  1:23 AM     START taking these medications   Details  benzonatate (TESSALON) 100 mg capsule Take one capsule (100 mg dose) by mouth every 8 (eight) hours for 7 days., Starting Wed 02/18/2024, Until Wed 02/25/2024, Normal        Discharge Medication List as of 02/18/2024  1:23 AM      Discharge Medication List as of 02/18/2024  1:23 AM      Clinical Impression Final diagnoses:  Bronchitis  Cough, unspecified type    ED Disposition     ED Disposition  Discharge   Condition  Stable   Comment  --                 Follow-up Information     Schedule an appointment as soon as possible for a visit  with Josette LELON Aho, PA.   Specialty: Physician Assistant Contact information: [address] [place] [address] [phone]         Go to  [facility].   Specialty: Emergency Medicine Comments: If symptoms worsen, As needed Contact information: [address] [place]   [phone]                 Electronically signed by:       [1] Social History Tobacco Use  Smoking Status Every Day  . Types: Cigarettes  Smokeless Tobacco Never  [2] Allergies Allergen Reactions  . Penicillins Hives  . Sulfa Antibiotics Hives  . Tobramycin Redness   Gina P Keen, PA-C 02/18/24 0155

## 2024-03-29 ENCOUNTER — Encounter: Payer: Self-pay | Admitting: Internal Medicine

## 2024-03-29 ENCOUNTER — Other Ambulatory Visit: Payer: Self-pay | Admitting: Internal Medicine

## 2024-03-29 ENCOUNTER — Ambulatory Visit: Attending: Internal Medicine | Admitting: Internal Medicine

## 2024-03-29 ENCOUNTER — Ambulatory Visit: Attending: Internal Medicine

## 2024-03-29 VITALS — BP 150/79 | HR 66 | Ht 64.0 in | Wt 151.0 lb

## 2024-03-29 DIAGNOSIS — I422 Other hypertrophic cardiomyopathy: Secondary | ICD-10-CM

## 2024-03-29 DIAGNOSIS — I10 Essential (primary) hypertension: Secondary | ICD-10-CM

## 2024-03-29 DIAGNOSIS — R002 Palpitations: Secondary | ICD-10-CM

## 2024-03-29 MED ORDER — LOSARTAN POTASSIUM 100 MG PO TABS: 100.0000 mg | ORAL_TABLET | Freq: Every day | ORAL | 3 refills | Status: AC

## 2024-03-29 NOTE — Patient Instructions (Signed)
 Medication Instructions:  Your physician has recommended you make the following change in your medication:  INCREASE: losartan  to 100 mg by mouth once daily  *If you need a refill on your cardiac medications before your next appointment, please call your pharmacy*  Lab Work: IN 2 WEEKS at [facility] in [place]: BMP   If you have labs (blood work) drawn today and your tests are completely normal, you will receive your results only by: [employer] (if you have MyChart) OR A paper copy in the mail If you have any lab test that is abnormal or we need to change your treatment, we will call you to review the results.  Testing/Procedures: Your physician has requested that you have an echocardiogram. Echocardiography is a painless test that uses sound waves to create images of your heart. It provides your doctor with information about the size and shape of your heart and how well your heart's chambers and valves are working. This procedure takes approximately one hour. There are no restrictions for this procedure. Please do NOT wear cologne, perfume, aftershave, or lotions (deodorant is allowed). Please arrive 15 minutes prior to your appointment time.  Please note: We ask at that you not bring children with you during ultrasound (echo/ vascular) testing. Due to room size and safety concerns, children are not allowed in the ultrasound rooms during exams. Our front office staff cannot provide observation of children in our lobby area while testing is being conducted. An adult accompanying a patient to their appointment will only be allowed in the ultrasound room at the discretion of the ultrasound technician under special circumstances. We apologize for any inconvenience.  Your physician has requested that you wear a heart monitor.   Follow-Up: At [facility], you and your health needs are our priority.  As part of our continuing mission to provide you with exceptional heart care, our  providers are all part of one team.  This team includes your primary Cardiologist (physician) and Advanced Practice Providers or APPs (Physician Assistants and Nurse Practitioners) who all work together to provide you with the care you need, when you need it.  Your next appointment:   8 month(s)  Provider:   Stanly Leavens, MD   We recommend signing up for the patient portal called MyChart.  Sign up information is provided on this After Visit Summary.  MyChart is used to connect with patients for Virtual Visits (Telemedicine).  Patients are able to view lab/test results, encounter notes, upcoming appointments, etc.  Non-urgent messages can be sent to your provider as well.   To learn more about what you can do with MyChart, go to [email or web address].   Other Instructions ZIO XT- Long Term Monitor Instructions  Your physician has requested you wear a ZIO patch monitor for 7 days.  This is a single patch monitor. [employer] supplies one patch monitor per enrollment. Additional stickers are not available. Please do not apply patch if you will be having a Nuclear Stress Test,   Cardiac CT, MRI, or Chest Xray during the period you would be wearing the  monitor. The patch cannot be worn during these tests. You cannot remove and re-apply the  ZIO XT patch monitor.  Your ZIO patch monitor will be mailed 3 day USPS to your address on file. It may take 3-5 days  to receive your monitor after you have been enrolled.  Once you have received your monitor, please review the enclosed instructions. Your monitor  has already been registered  assigning a specific monitor serial # to you.  Billing and Patient Assistance Program Information  We have supplied [employer] with any of your insurance information on file for billing purposes. [employer] offers a sliding scale Patient Assistance Program for patients that do not have  insurance, or whose insurance does not completely cover the cost of the ZIO  monitor.  You must apply for the Patient Assistance Program to qualify for this discounted rate.  To apply, please call [employer] at [phone], select option 4, select option 2, ask to apply for  Patient Assistance Program. Meredeth will ask your household income, and how many people  are in your household. They will quote your out-of-pocket cost based on that information.  [employer] will also be able to set up a 7-month, interest-free payment plan if needed.  Applying the monitor   Shave hair from upper left chest.  Hold abrader disc by orange tab. Rub abrader in 40 strokes over the upper left chest as  indicated in your monitor instructions.  Clean area with 4 enclosed alcohol pads. Let dry.  Apply patch as indicated in monitor instructions. Patch will be placed under collarbone on left  side of chest with arrow pointing upward.  Rub patch adhesive wings for 2 minutes. Remove white label marked 1. Remove the white  label marked 2. Rub patch adhesive wings for 2 additional minutes.  While looking in a mirror, press and release button in center of patch. A small green light will  flash 3-4 times. This will be your only indicator that the monitor has been turned on.  Do not shower for the first 24 hours. You may shower after the first 24 hours.  Press the button if you feel a symptom. You will hear a small click. Record Date, Time and  Symptom in the Patient Logbook.  When you are ready to remove the patch, follow instructions on the last 2 pages of Patient  Logbook. Stick patch monitor onto the last page of Patient Logbook.  Place Patient Logbook in the blue and white box. Use locking tab on box and tape box closed  securely. The blue and white box has prepaid postage on it. Please place it in the mailbox as  soon as possible. Your physician should have your test results approximately 7 days after the  monitor has been mailed back to [facility].  Call [facility] Customer Care at  [phone] if you have questions regarding  your ZIO XT patch monitor. Call them immediately if you see an orange light blinking on your  monitor.  If your monitor falls off in less than 4 days, contact our Monitor department at [phone].  If your monitor becomes loose or falls off after 4 days call [employer] at [phone] for  suggestions on securing your monitor

## 2024-03-29 NOTE — Progress Notes (Unsigned)
 Enrolled for [employer] to mail a ZIO XT long term holter monitor to the patients address on file.

## 2024-03-29 NOTE — Progress Notes (Signed)
 Cardiology Office Note:  .    Date:  03/29/2024  ID:  Gina Lucero, DOB 04-Nov-1960, MRN [id number] PCP: Bettie Dorothe BROCKS., MD (Inactive)  [facility] Providers Cardiologist:  None     CC: Apical HCM eval Consulted for the evaluation of Apical HCM at the behest of Dr. Bettie   History of Present Illness: .    Gina Lucero is a 63 y.o. 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 She is accompanied by her sister.  She has a history of apical hypertrophic cardiomyopathy, initially presenting with an atypical syncopal episode three years ago. A cardiac MRI in 2022 showed a maximum apical thickness of 21 mm, no fibrosis, no aneurysm, and no first pass ischemia. She underwent heart catheterization on November 15, 2020, at [facility], which revealed a pulmonary artery fistula, although this finding was not commented on in subsequent reports. She was airlifted to [facility] two days before her son's wedding due to the syncopal episode, which occurred while she was driving. She was previously on hydrochlorothiazide  25 mg for blood pressure, but her potassium levels dropped, leading to the syncopal episode. She is currently on losartan  50 mg for blood pressure management.  She has a family history of sudden cardiac death, with four family members affected. Her sister and daughter have undergone echocardiogram screening for hypertrophic cardiomyopathy. Her brother is currently experiencing issues and is scheduled to see an atrial fibrillation surgeon at the end of September.  She is a smoker and is working on quitting. Her husband is also working on quitting smoking.  She frequently experiences nausea over the last several weeks and has episodes of feeling unwell, which she attributes to high blood pressure. She has a history of hypothyroidism and experiences occasional heart flutters, which she initially  attributed to her hypertrophic cardiomyopathy.  Discussed the use of AI scribe software for clinical note transcription with the patient, who gave verbal consent to proceed.   Relevant histories: .  Social  - Tobacco: Current smoker. Patient is trying to quit smoking, and her husband is also working on quitting. - Partner Status: Married - Living Situation: Lives in [place], near [place]. - her sister is Burnard Dennis, an [facility] patient of mine - kids have been screened negative in 2025. ROS: As per HPI.   Physical Exam:    VS:  BP (!) 150/79   Pulse 66   Ht 5' 4 (1.626 m)   Wt 151 lb (68.5 kg)   LMP 10/10/2013   SpO2 97%   BMI 25.92 kg/m    Wt Readings from Last 3 Encounters:  03/29/24 151 lb (68.5 kg)  10/18/16 155 lb (70.3 kg)  10/30/15 150 lb (68 kg)    Gen: no distress, smells of smoke   Neck: No JVD Cardiac: No Rubs or Gallops, no Murmur, RRR +2 radial pulses Respiratory: Clear to auscultation bilaterally, normal effort, normal  respiratory rate GI: Soft, nontender, non-distended  MS: No  edema;  moves all extremities Integument: Skin feels warm Neuro:  At time of evaluation, alert and oriented to person/place/time/situation  Psych: Normal affect, patient feels ok   ASSESSMENT AND PLAN: .     An EKG was ordered for HCM and shows SR with diffuse TWI consistent with apical HCM  Hypertrophic Cardiomyopathy - Apical Variant -  No Apical Aneurysm or scar in 2022 - suspicion of Fabry's/Danon/Noonan's or other mimics of HCM: low -  Gene variant: Negative - NYHA II  - Non HCM Contributors to disease/status  Hypertension Severe hypertension with systolic readings up to 170 mmHg. Symptoms include nausea and visual disturbances. Currently on losartan  50 mg. Previous use of hydrochlorothiazide  led to hypokalemia and syncope. Smoking cessation and medication adjustment are advised to improve blood pressure control. 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 No further mention in subsequent evaluations. Unclear clinical significance. Potential for anomalous coronary artery to pulmonary artery fistula, but not confirmed. Further investigation is contingent on insurance status and clinical findings. - Review heart catheterization films from 11/25/2020 to assess pulmonary artery fistula - Consider CT scan if further investigation is needed after reviewing films  Hypothyroidism (improving) Hypothyroidism previously caused symptoms such as heart palpitations. Symptoms have improved with medication adjustments. Continued monitoring is necessary to ensure optimal thyroid function.  Tobacco use disorder Current smoker with plans to quit. Smoking cessation is advised to improve blood pressure and overall cardiovascular health. Support from family members is encouraged to aid in cessation efforts. - Advise on smoking cessation  Family history reviewed, Discussed family screening  - son and daughter need echo in 5 years; handout given  SCD  Assessment - Echo from 2022 notable for  for no aneurysm - CMR from 2022 notable for for normal QP/QS and no scar or aneurysm - will repeat heart monitor  Atrial fibrillation Assessment  - HCM-AF score 22 - Atrial arrhythmia management: heart monitor as above; sister has AF  Medication symptom plan - no apparent ApHCM sx   April 2026 f/u unless issues.      Stanly Leavens, MD FASE [facility] Cardiologist [facility]  [address] [place], [address] [place], [place] [address] [phone]  10:50 AM

## 2024-03-31 ENCOUNTER — Encounter: Payer: Self-pay | Admitting: Internal Medicine

## 2024-04-13 DIAGNOSIS — I10 Essential (primary) hypertension: Secondary | ICD-10-CM | POA: Diagnosis not present

## 2024-04-14 LAB — BASIC METABOLIC PANEL WITH GFR
BUN/Creatinine Ratio: 16 (ref 12–28)
BUN: 14 mg/dL (ref 8–27)
CO2: 21 mmol/L (ref 20–29)
Calcium: 10.1 mg/dL (ref 8.7–10.3)
Chloride: 105 mmol/L (ref 96–106)
Creatinine, Ser: 0.86 mg/dL (ref 0.57–1.00)
Glucose: 115 mg/dL — ABNORMAL HIGH (ref 70–99)
Potassium: 4.2 mmol/L (ref 3.5–5.2)
Sodium: 142 mmol/L (ref 134–144)
eGFR: 76 mL/min/1.73 (ref >59)

## 2024-04-15 ENCOUNTER — Ambulatory Visit: Payer: Self-pay | Admitting: Internal Medicine

## 2024-04-15 DIAGNOSIS — R002 Palpitations: Secondary | ICD-10-CM | POA: Diagnosis not present

## 2024-04-15 DIAGNOSIS — I422 Other hypertrophic cardiomyopathy: Secondary | ICD-10-CM | POA: Diagnosis not present

## 2024-04-21 ENCOUNTER — Ambulatory Visit (HOSPITAL_COMMUNITY)
Admission: RE | Admit: 2024-04-21 | Discharge: 2024-04-21 | Disposition: A | Discharge status: Home / Self Care | Source: Ambulatory Visit | Attending: Internal Medicine | Admitting: Internal Medicine

## 2024-04-21 DIAGNOSIS — I10 Essential (primary) hypertension: Secondary | ICD-10-CM | POA: Diagnosis not present

## 2024-04-21 DIAGNOSIS — I422 Other hypertrophic cardiomyopathy: Secondary | ICD-10-CM | POA: Diagnosis not present

## 2024-04-21 DIAGNOSIS — R002 Palpitations: Secondary | ICD-10-CM | POA: Diagnosis not present

## 2024-04-21 LAB — ECHOCARDIOGRAM COMPLETE
AR max vel: 2.62 cm2
AV Area VTI: 2.65 cm2
AV Area mean vel: 2.74 cm2
AV Mean grad: 4 mmHg
AV Peak grad: 8.4 mmHg
Ao pk vel: 1.45 m/s
Area-P 1/2: 3.77 cm2
MV M vel: 5.5 m/s
MV Peak grad: 121 mmHg
Radius: 0.7 cm
S' Lateral: 2.6 cm

## 2024-04-21 NOTE — Progress Notes (Signed)
*  PRELIMINARY RESULTS* Echocardiogram 2D Echocardiogram has been performed.  Gina Lucero 04/21/2024, 10:45 AM

## 2024-04-22 DIAGNOSIS — R002 Palpitations: Secondary | ICD-10-CM | POA: Diagnosis not present

## 2024-04-22 DIAGNOSIS — I422 Other hypertrophic cardiomyopathy: Secondary | ICD-10-CM

## 2024-04-27 ENCOUNTER — Other Ambulatory Visit (HOSPITAL_COMMUNITY): Payer: Self-pay | Admitting: Cardiology

## 2024-04-27 ENCOUNTER — Inpatient Hospital Stay
Admission: RE | Admit: 2024-04-27 | Discharge: 2024-04-27 | Disposition: A | Discharge status: Home / Self Care | Payer: Self-pay | Source: Ambulatory Visit | Attending: Internal Medicine

## 2024-04-27 ENCOUNTER — Telehealth: Payer: Self-pay | Admitting: Internal Medicine

## 2024-04-27 ENCOUNTER — Inpatient Hospital Stay
Admission: RE | Admit: 2024-04-27 | Discharge: 2024-04-27 | Disposition: A | Discharge status: Home / Self Care | Payer: Self-pay | Source: Ambulatory Visit | Attending: Internal Medicine | Admitting: Internal Medicine

## 2024-04-27 DIAGNOSIS — I2510 Atherosclerotic heart disease of native coronary artery without angina pectoris: Secondary | ICD-10-CM

## 2024-04-27 DIAGNOSIS — I422 Other hypertrophic cardiomyopathy: Secondary | ICD-10-CM

## 2024-04-27 NOTE — Addendum Note (Signed)
 Addended by: RANDY HAMP SAILOR on: 04/27/2024 05:22 PM   Modules accepted: Orders

## 2024-04-27 NOTE — Telephone Encounter (Signed)
 Called Patient in the setting of query of LAD-PA fistula We have received her outside films and they have been personally reviewed.  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 No abnormal Qp/QS or remodeling noted on OSH CMR.  Patient has no sx.  Echo shows no evidence of remodeling.  We had planned for CMR in one year. She has had no symptoms but needs CMR for scar eval. She is getting new insurance in 08/2023.  Recommend moving up CMR to March 2026 and adding QP/QS flow evaluation.  If needed can then to CCTA +/- Cath  Patient had no further questions.  Stanly Leavens, MD Cardiologist [facility]  [address] [place], [address] [place], [place] [address] [phone]  4:32 PM

## 2024-04-27 NOTE — Telephone Encounter (Signed)
 MD received cath films Dr. Santo called pt reviewed results and recommendations.  Please telephone encounter for details.

## 2024-05-24 DIAGNOSIS — Z1231 Encounter for screening mammogram for malignant neoplasm of breast: Secondary | ICD-10-CM | POA: Diagnosis not present

## 2024-06-11 DIAGNOSIS — R928 Other abnormal and inconclusive findings on diagnostic imaging of breast: Secondary | ICD-10-CM | POA: Diagnosis not present

## 2024-06-11 DIAGNOSIS — N6489 Other specified disorders of breast: Secondary | ICD-10-CM | POA: Diagnosis not present

## 2024-10-12 ENCOUNTER — Other Ambulatory Visit (HOSPITAL_COMMUNITY)
